# Patient Record
Sex: Female | Born: 1960
Health system: Southern US, Community
[De-identification: ages and names within clinical notes are randomized; demographics above are authoritative.]

## PROBLEM LIST (undated history)

## (undated) DIAGNOSIS — E079 Disorder of thyroid, unspecified: Secondary | ICD-10-CM

## (undated) DIAGNOSIS — M1990 Unspecified osteoarthritis, unspecified site: Secondary | ICD-10-CM

## (undated) DIAGNOSIS — J40 Bronchitis, not specified as acute or chronic: Secondary | ICD-10-CM

## (undated) DIAGNOSIS — I341 Nonrheumatic mitral (valve) prolapse: Secondary | ICD-10-CM

## (undated) DIAGNOSIS — E039 Hypothyroidism, unspecified: Secondary | ICD-10-CM

## (undated) DIAGNOSIS — C439 Malignant melanoma of skin, unspecified: Secondary | ICD-10-CM

## (undated) DIAGNOSIS — J302 Other seasonal allergic rhinitis: Secondary | ICD-10-CM

## (undated) HISTORY — DX: Unspecified osteoarthritis, unspecified site: M19.90

## (undated) HISTORY — DX: Other seasonal allergic rhinitis: J30.2

## (undated) HISTORY — PX: ACHILLES TENDON REPAIR: SUR1153

## (undated) HISTORY — DX: Nonrheumatic mitral (valve) prolapse: I34.1

## (undated) HISTORY — PX: ELBOW SURGERY: SHX618

## (undated) HISTORY — DX: Hypothyroidism, unspecified: E03.9

## (undated) HISTORY — PX: CLAVICLE SURGERY: SHX598

## (undated) HISTORY — PX: FOOT SURGERY: SHX648

## (undated) HISTORY — DX: Malignant melanoma of skin, unspecified: C43.9

---

## 1990-06-25 HISTORY — PX: TUBAL LIGATION: SHX77

## 2009-08-23 ENCOUNTER — Other Ambulatory Visit: Admission: RE | Admit: 2009-08-23 | Discharge: 2009-08-23 | Payer: Self-pay | Admitting: Internal Medicine

## 2013-05-04 DIAGNOSIS — H43819 Vitreous degeneration, unspecified eye: Secondary | ICD-10-CM | POA: Insufficient documentation

## 2016-08-13 ENCOUNTER — Emergency Department (HOSPITAL_COMMUNITY)
Admission: EM | Admit: 2016-08-13 | Discharge: 2016-08-13 | Disposition: A | Discharge status: Home / Self Care | Payer: Managed Care, Other (non HMO) | Attending: Emergency Medicine | Admitting: Emergency Medicine

## 2016-08-13 ENCOUNTER — Encounter (HOSPITAL_COMMUNITY): Payer: Self-pay | Admitting: Emergency Medicine

## 2016-08-13 ENCOUNTER — Emergency Department (HOSPITAL_COMMUNITY): Payer: Managed Care, Other (non HMO)

## 2016-08-13 DIAGNOSIS — Z79899 Other long term (current) drug therapy: Secondary | ICD-10-CM | POA: Diagnosis not present

## 2016-08-13 DIAGNOSIS — Z87891 Personal history of nicotine dependence: Secondary | ICD-10-CM | POA: Insufficient documentation

## 2016-08-13 DIAGNOSIS — Z791 Long term (current) use of non-steroidal anti-inflammatories (NSAID): Secondary | ICD-10-CM | POA: Insufficient documentation

## 2016-08-13 DIAGNOSIS — R091 Pleurisy: Secondary | ICD-10-CM

## 2016-08-13 DIAGNOSIS — J988 Other specified respiratory disorders: Secondary | ICD-10-CM | POA: Insufficient documentation

## 2016-08-13 DIAGNOSIS — B9789 Other viral agents as the cause of diseases classified elsewhere: Secondary | ICD-10-CM

## 2016-08-13 HISTORY — DX: Bronchitis, not specified as acute or chronic: J40

## 2016-08-13 HISTORY — DX: Disorder of thyroid, unspecified: E07.9

## 2016-08-13 LAB — BASIC METABOLIC PANEL
Anion gap: 8 (ref 5–15)
BUN: 19 mg/dL (ref 6–20)
CALCIUM: 9.2 mg/dL (ref 8.9–10.3)
CHLORIDE: 106 mmol/L (ref 101–111)
CO2: 27 mmol/L (ref 22–32)
CREATININE: 0.82 mg/dL (ref 0.44–1.00)
GFR calc Af Amer: >60 mL/min (ref >60)
GFR calc non Af Amer: >60 mL/min (ref >60)
Glucose, Bld: 92 mg/dL (ref 65–99)
Potassium: 4.4 mmol/L (ref 3.5–5.1)
Sodium: 141 mmol/L (ref 135–145)

## 2016-08-13 LAB — TROPONIN I: Troponin I: <0.03 ng/mL (ref <0.03)

## 2016-08-13 LAB — CBC
HCT: 41.8 % (ref 36.0–46.0)
HEMOGLOBIN: 14 g/dL (ref 12.0–15.0)
MCH: 30 pg (ref 26.0–34.0)
MCHC: 33.5 g/dL (ref 30.0–36.0)
MCV: 89.7 fL (ref 78.0–100.0)
PLATELETS: 265 10*3/uL (ref 150–400)
RBC: 4.66 MIL/uL (ref 3.87–5.11)
RDW: 12.3 % (ref 11.5–15.5)
WBC: 7.2 10*3/uL (ref 4.0–10.5)

## 2016-08-13 NOTE — ED Triage Notes (Signed)
Patient c/o right side chest pain that radiates into right chest. Per patient 3-4 days. Denies any shortness of breath, dizziness, nausea, or vomiting. Per patient worse with deep breath. Patient states had flu and bronchitis prior to chest pain and has had cough. Patient has had pleurisy before and feels the same.

## 2016-08-13 NOTE — ED Provider Notes (Signed)
[facility] DEPT Provider Note   CSN: [id number] Arrival date & time: 08/13/16  1754  By signing my name below, I, Oleh Genin, attest that this documentation has been prepared under the direction and in the presence of Forde Dandy, MD. Electronically Signed: Oleh Genin, Scribe. 08/13/16. 9:57 PM.   History   Chief Complaint Chief Complaint  Patient presents with  . Pleurisy    HPI Brittney Schmitt is a 56 y.o. female with history of hypothyroidism who presents to the ED for evaluation of chest pain. This patient states that 10 days ago she developed productive cough, subjective fevers and chills, and fatigue. She was seen by primary care and diagnosed with bronchitis. She finished a course of azithromycin several days ago. She presents stating that in the last 3 days she has experienced R lateral chest wall pain which is worse "when coughing or breathing deeply". Sharp in nature. Not exertional. She continues to experience subjective fevers, cough, fatigue. No dyspnea. Denies any peripheral edema. She has sick contacts at work. Denies nausea, vomiting, diarrhea. Denies dysuria or frequency. No history of DVT/PE, leg swelling, family history of bleeding or clotting disorders, or recent long distance travel.   The history is provided by the patient. No language interpreter was used.    Past Medical History:  Diagnosis Date  . Bronchitis   . Thyroid disease     There are no active problems to display for this patient.   Past Surgical History:  Procedure Laterality Date  . ACHILLES TENDON REPAIR Left   . CLAVICLE SURGERY    . ELBOW SURGERY Left   . FOOT SURGERY      OB History    Gravida Para Term Preterm AB Living   2 2 2     2    SAB TAB Ectopic Multiple Live Births                   Home Medications    Prior to Admission medications   Medication Sig Start Date End Date Taking? Authorizing Provider  BIOTIN PO Take 1 tablet by mouth daily.   Yes Historical  Provider, MD  ibuprofen (ADVIL,MOTRIN) 800 MG tablet Take 800 mg by mouth every 8 (eight) hours as needed for mild pain or moderate pain.   Yes Historical Provider, MD  Multiple Vitamin (MULTIVITAMIN WITH MINERALS) TABS tablet Take 1 tablet by mouth daily.   Yes Historical Provider, MD  Omega-3 300 MG CAPS Take 1 capsule by mouth daily.   Yes Historical Provider, MD  promethazine-dextromethorphan (PROMETHAZINE-DM) 6.25-15 MG/5ML syrup Take 5 mLs by mouth every 6 (six) hours as needed. 08/06/16  Yes Historical Provider, MD  SYNTHROID 100 MCG tablet Take 100 mcg by mouth daily. 07/10/16  Yes Historical Provider, MD  vitamin C (ASCORBIC ACID) 500 MG tablet Take 500 mg by mouth daily.   Yes Historical Provider, MD  azithromycin (ZITHROMAX) 250 MG tablet Take 250-500 mg by mouth See admin instructions. Take 500mg  on ady 1, then take 250mg  on days 2 through 5. 08/06/16   Historical Provider, MD    Family History History reviewed. No pertinent family history.  Social History Social History  Substance Use Topics  . Smoking status: Former Smoker    Years: 15.00    Types: Cigarettes    Quit date: 06/25/2000  . Smokeless tobacco: Never Used  . Alcohol use 3.0 oz/week    5 Cans of beer per week     Allergies   Codeine  Review of Systems Review of Systems 10/14 systems reviewed and are negative except as noted in the HPI.  Physical Exam Updated Vital Signs BP 159/96   Pulse (!) 57   Temp 98.7 F (37.1 C) (Oral)   Resp 16   Ht 5\' 8"  (1.727 m)   Wt 150 lb (68 kg)   SpO2 96%   BMI 22.81 kg/m   Physical Exam Physical Exam  Nursing note and vitals reviewed. Constitutional: Well developed, well nourished, non-toxic, and in no acute distress Head: Normocephalic and atraumatic.  Mouth/Throat: Oropharynx is clear and moist.  Neck: Normal range of motion. Neck supple.  Cardiovascular: Normal rate and regular rhythm.  No peripheral edema or calf tenderness. Pulmonary/Chest: Effort normal  and breath sounds normal.  Abdominal: Soft. There is no tenderness. There is no rebound and no guarding.  Musculoskeletal: Normal range of motion.  Neurological: Alert, no facial droop, fluent speech, moves all extremities symmetrically Skin: Skin is warm and dry.  Psychiatric: Cooperative  ED Treatments / Results  DIAGNOSTIC STUDIES: Oxygen Saturation is 100 percent on room air which is normal by my interpretation.    COORDINATION OF CARE: 9:53 PM Discussed treatment plan with pt at bedside and pt agreed to plan.  Labs (all labs ordered are listed, but only abnormal results are displayed) Labs Reviewed  BASIC METABOLIC PANEL  CBC  TROPONIN I    EKG  EKG Interpretation  Date/Time:  Monday August 13 2016 18:02:26 EST Ventricular Rate:  72 PR Interval:  160 QRS Duration: 78 QT Interval:  386 QTC Calculation: 422 R Axis:   50 Text Interpretation:  Normal sinus rhythm Normal ECG no acute changes  Confirmed by Jamail Cullers MD, Ranbir Chew [phone]) on 08/13/2016 9:51:34 PM       Radiology Dg Chest 2 View  Result Date: 08/13/2016 CLINICAL DATA:  Right-sided chest pain for several days, worse today. EXAM: CHEST  2 VIEW COMPARISON:  None. FINDINGS: The cardiomediastinal silhouette is within normal limits. The lungs are hyperinflated. There is blunting of the costophrenic angles which may be secondary to hyperinflation versus trace pleural effusions. No airspace consolidation, edema, or pneumothorax is seen. No acute osseous abnormality is identified. IMPRESSION: 1. Mild hyperinflation without evidence of acute airspace disease. 2. Possible trace pleural effusions. Electronically Signed   By: Logan Bores M.D.   On: 08/13/2016 18:37    Procedures Procedures (including critical care time)  Medications Ordered in ED Medications - No data to display   Initial Impression / Assessment and Plan / ED Course  I have reviewed the triage vital signs and the nursing notes.  Pertinent labs & imaging  results that were available during my care of the patient were reviewed by me and considered in my medical decision making (see chart for details).     56 year old female otherwise healthy who presents with right-sided chest pain in the setting of viral illness. Presentation seems consistent with that of pleurisy, which she states that she has had before in the past and this is exactly like it. She is well-appearing and in no acute distress. Her vital signs are non-concerning. No PE risk factors and not tachycardic or short of breath. Her EKG does not show any ischemic changes, and her troponin triage is normal. No major risk factors for ACS aside from her age, and presentation seems very atypical for ACS. Chest x-ray visualized a does not show pneumonia, pneumothorax, edema, or other acute cardiopulmonary processes.  Discussed potential screen for PE with ddimer  as she cannot undergo PERC criteria due to age. She has declined this as this feels like pleurisy. Agreed to return if her symptoms change. Discussed supportive care management for her pleurisy. Strict return and follow-up instructions reviewed. She expressed understanding of all discharge instructions and felt comfortable with the plan of care.   Final Clinical Impressions(s) / ED Diagnoses   Final diagnoses:  Pleurisy  Viral respiratory illness    New Prescriptions New Prescriptions   No medications on file   I personally performed the services described in this documentation, which was scribed in my presence. The recorded information has been reviewed and is accurate.    Forde Dandy, MD 08/13/16 2218

## 2016-08-13 NOTE — Discharge Instructions (Signed)
Continue taking tylenol and ibuprofen for pain.  Please follow-up closely with your primary care doctor for recheck.  Return without fail for worsening symptoms, including difficulty breathing, passing out, worsening pain, or any other symptoms concerning to you.

## 2016-08-21 ENCOUNTER — Other Ambulatory Visit (HOSPITAL_COMMUNITY): Payer: Self-pay | Admitting: Respiratory Therapy

## 2016-08-21 DIAGNOSIS — R0600 Dyspnea, unspecified: Secondary | ICD-10-CM

## 2016-08-31 ENCOUNTER — Ambulatory Visit (HOSPITAL_COMMUNITY)
Admission: RE | Admit: 2016-08-31 | Discharge: 2016-08-31 | Disposition: A | Discharge status: Home / Self Care | Payer: Managed Care, Other (non HMO) | Source: Ambulatory Visit | Attending: Internal Medicine | Admitting: Internal Medicine

## 2016-08-31 DIAGNOSIS — R0600 Dyspnea, unspecified: Secondary | ICD-10-CM | POA: Diagnosis not present

## 2016-08-31 DIAGNOSIS — J449 Chronic obstructive pulmonary disease, unspecified: Secondary | ICD-10-CM | POA: Insufficient documentation

## 2016-08-31 LAB — PULMONARY FUNCTION TEST
DL/VA % PRED: 74 %
DL/VA: 3.92 ml/min/mmHg/L
DLCO UNC: 18.65 ml/min/mmHg
DLCO unc % pred: 62 %
FEF 25-75 Pre: 1.86 L/sec
FEF2575-%Pred-Pre: 68 %
FEV1-%Pred-Pre: 84 %
FEV1-Pre: 2.56 L
FEV1FVC-%Pred-Pre: 93 %
FEV6-%Pred-Pre: 91 %
FEV6-PRE: 3.45 L
FEV6FVC-%PRED-PRE: 102 %
FVC-%Pred-Pre: 89 %
FVC-Pre: 3.47 L
PRE FEV6/FVC RATIO: 99 %
Pre FEV1/FVC ratio: 74 %
RV % PRED: 90 %
RV: 1.92 L
TLC % pred: 97 %
TLC: 5.53 L

## 2016-09-11 ENCOUNTER — Institutional Professional Consult (permissible substitution): Payer: Managed Care, Other (non HMO) | Admitting: Pulmonary Disease

## 2016-10-10 ENCOUNTER — Encounter: Payer: Self-pay | Admitting: Internal Medicine

## 2016-10-11 ENCOUNTER — Encounter: Payer: Self-pay | Admitting: Internal Medicine

## 2016-10-11 ENCOUNTER — Ambulatory Visit (INDEPENDENT_AMBULATORY_CARE_PROVIDER_SITE_OTHER)
Admission: RE | Admit: 2016-10-11 | Discharge: 2016-10-11 | Disposition: A | Discharge status: Home / Self Care | Payer: Managed Care, Other (non HMO) | Source: Ambulatory Visit | Attending: Internal Medicine | Admitting: Internal Medicine

## 2016-10-11 ENCOUNTER — Ambulatory Visit (INDEPENDENT_AMBULATORY_CARE_PROVIDER_SITE_OTHER): Payer: Managed Care, Other (non HMO) | Admitting: Internal Medicine

## 2016-10-11 VITALS — BP 108/70 | HR 61 | Ht 67.0 in | Wt 147.4 lb

## 2016-10-11 DIAGNOSIS — R0609 Other forms of dyspnea: Secondary | ICD-10-CM | POA: Diagnosis not present

## 2016-10-11 NOTE — Progress Notes (Signed)
Subjective:    Patient ID: Brittney Schmitt, female    DOB: [birth year],   MRN: [id number]  HPI   56 yowf quit smoking 2002 no respiratory problems x for rhinitis mostly in spring and some doe running no difference then acutely ill with flu like symptoms then dx as "walking Pna"  With mild R ant pleuritic cp and rx zpak / another abx and cough med and gradually improved over 6 weeks but pfts abn so referred to pulmonary clinic 10/11/2016 by Dr   Jani Gravel   10/11/2016 1st [facility] office visit/ [facility]   Chief Complaint  Patient presents with  . Pulmonary Consult    Referred by Dr Maudie Mercury for SOB and abn PFTs.   back playing tennis 3-5 days a week  Training for a 5 k but can't do it s stopping which has been true x 5 years  All cp gone, no cough   No obvious day to day or daytime variability or assoc excess/ purulent sputum or mucus plugs or hemoptysis or cp or chest tightness, subjective wheeze or overt   hb symptoms. No unusual exp hx or h/o childhood pna/ asthma or knowledge of premature birth.  Sleeping ok without nocturnal  or early am exacerbation  of respiratory  c/o's or need for noct saba. Also denies any obvious fluctuation of symptoms with weather or environmental changes or other aggravating or alleviating factors except as outlined above   Current Medications, Allergies, Complete Past Medical History, Past Surgical History, Family History, and Social History were reviewed in [employer] record.        Review of Systems  Constitutional: Negative for fever and unexpected weight change.  HENT: Positive for congestion and sneezing. Negative for dental problem, ear pain, nosebleeds, postnasal drip, rhinorrhea, sinus pressure, sore throat and trouble swallowing.   Eyes: Negative for redness and itching.  Respiratory: Positive for shortness of breath. Negative for cough, chest tightness and wheezing.   Cardiovascular: Negative for palpitations and leg  swelling.  Gastrointestinal: Negative for nausea and vomiting.  Genitourinary: Negative for dysuria.  Musculoskeletal: Negative for joint swelling.  Skin: Negative for rash.  Neurological: Negative for headaches.  Hematological: Does not bruise/bleed easily.  Psychiatric/Behavioral: Negative for dysphoric mood. The patient is not nervous/anxious.        Objective:   Physical Exam  amb wf nad  Wt Readings from Last 3 Encounters:  10/11/16 147 lb 6.4 oz (66.9 kg)  08/13/16 150 lb (68 kg)    Vital signs reviewed  - Note on arrival 02 sats  99% on RA    HEENT: nl dentition, turbinates bilaterally, and oropharynx. Nl external ear canals without cough reflex   NECK :  without JVD/Nodes/TM/ nl carotid upstrokes bilaterally   LUNGS: no acc muscle use,  Nl contour chest which is clear to A and P bilaterally without cough on insp or exp maneuvers   CV:  RRR  no s3 or murmur or increase in P2, and no edema   ABD:  soft and nontender with nl inspiratory excursion in the supine position. No bruits or organomegaly appreciated, bowel sounds nl  MS:  Nl gait/ ext warm without deformities, calf tenderness, cyanosis or clubbing No obvious joint restrictions   SKIN: warm and dry without lesions    NEURO:  alert, approp, nl sensorium with  no motor or cerebellar deficits apparent.     CXR PA and Lateral:   10/11/2016 :    I  personally reviewed images and agree with radiology impression as follows:    Mediastinum hilar structures are normal. Interim resolution of right base subsegmental atelectasis. No focal infiltrate. No prominent pleural effusion or pneumothorax. Heart size normal. Normal pulmonary vascularity. Mild thoracolumbar spine scoliosis. No acute bony abnormality.        Assessment & Plan:

## 2016-10-11 NOTE — Patient Instructions (Addendum)
Monitor your pulse oximetry with exertion and if dropping any more than 4% or below 90% you will need follow up here   Avoid macrodantin for kidney infection  Please remember to go to the x-ray department downstairs in the basement  for your tests - we will call you with the results when they are available.  A copy of this consultation will be sent to your PCP

## 2016-10-11 NOTE — Assessment & Plan Note (Addendum)
-   PFT's  08/28/2016  FVC 3.47 (89%)   with DLCO  62 % corrects to 72 % for alv volume    She is concerned because her father apparently had IPF but this dlco was obtained w/in a few weeks of acute resp infection and she has since resumed nl ex which includes training for a 5k so I see nothing to suggest incipient ipf here  I did advise if she wants "early warning" the first physiologic abn = desats with heavy exertion which can be monitored by using pulse ox while jogging with f/u HRCT if notes any significant drop even at high levels of exercise as this should not be the case with nl lung function.  Total time devoted to counseling  > 50 % of initial 60 min office visit:  review case with pt/ discussion of options/alternatives/ personally creating written customized instructions  in presence of pt  then going over those specific  Instructions directly with the pt including how to use all of the meds but in particular covering each new medication in detail and the difference between the maintenance= "automatic" meds and the prns using an action plan format for the latter (If this problem/symptom => do that organization reading Left to right).  Please see AVS from this visit for a full list of these instructions which I personally wrote for this pt and  are unique to this visit.

## 2016-10-12 ENCOUNTER — Telehealth: Payer: Self-pay | Admitting: Internal Medicine

## 2016-10-12 NOTE — Telephone Encounter (Signed)
Call pt: Reviewed cxr and no acute change so no change in recommendations made at ov .  Called and lmom to make the pt aware of results.

## 2016-10-25 ENCOUNTER — Encounter: Payer: Self-pay | Admitting: Family Medicine

## 2016-11-15 ENCOUNTER — Encounter: Payer: Self-pay | Admitting: Family Medicine

## 2016-11-20 ENCOUNTER — Encounter: Payer: Self-pay | Admitting: Family Medicine

## 2016-11-20 ENCOUNTER — Other Ambulatory Visit: Payer: Managed Care, Other (non HMO)

## 2016-11-20 ENCOUNTER — Other Ambulatory Visit: Payer: Self-pay | Admitting: Family Medicine

## 2016-11-20 ENCOUNTER — Telehealth: Payer: Self-pay | Admitting: Family Medicine

## 2016-11-20 DIAGNOSIS — E039 Hypothyroidism, unspecified: Secondary | ICD-10-CM | POA: Insufficient documentation

## 2016-11-20 DIAGNOSIS — Z0000 Encounter for general adult medical examination without abnormal findings: Secondary | ICD-10-CM | POA: Insufficient documentation

## 2016-11-21 LAB — CMP14+EGFR
A/G RATIO: 2 (ref 1.2–2.2)
ALK PHOS: 76 IU/L (ref 39–117)
ALT: 12 IU/L (ref 0–32)
AST: 23 IU/L (ref 0–40)
Albumin: 4.4 g/dL (ref 3.5–5.5)
BUN/Creatinine Ratio: 16 (ref 9–23)
BUN: 15 mg/dL (ref 6–24)
Bilirubin Total: 0.5 mg/dL (ref 0.0–1.2)
CO2: 24 mmol/L (ref 18–29)
CREATININE: 0.91 mg/dL (ref 0.57–1.00)
Calcium: 9.1 mg/dL (ref 8.7–10.2)
Chloride: 107 mmol/L — ABNORMAL HIGH (ref 96–106)
GFR calc Af Amer: 81 mL/min/{1.73_m2} (ref >59)
GFR calc non Af Amer: 70 mL/min/{1.73_m2} (ref >59)
GLOBULIN, TOTAL: 2.2 g/dL (ref 1.5–4.5)
Glucose: 84 mg/dL (ref 65–99)
POTASSIUM: 5.1 mmol/L (ref 3.5–5.2)
SODIUM: 142 mmol/L (ref 134–144)
Total Protein: 6.6 g/dL (ref 6.0–8.5)

## 2016-11-21 LAB — CBC WITH DIFFERENTIAL/PLATELET
BASOS: 1 %
Basophils Absolute: 0.1 10*3/uL (ref 0.0–0.2)
EOS (ABSOLUTE): 0.4 10*3/uL (ref 0.0–0.4)
EOS: 6 %
HEMATOCRIT: 44.8 % (ref 34.0–46.6)
Hemoglobin: 14.3 g/dL (ref 11.1–15.9)
Immature Grans (Abs): 0 10*3/uL (ref 0.0–0.1)
Immature Granulocytes: 0 %
Lymphocytes Absolute: 2.1 10*3/uL (ref 0.7–3.1)
Lymphs: 38 %
MCH: 29.2 pg (ref 26.6–33.0)
MCHC: 31.9 g/dL (ref 31.5–35.7)
MCV: 92 fL (ref 79–97)
MONOS ABS: 0.4 10*3/uL (ref 0.1–0.9)
Monocytes: 8 %
NEUTROS PCT: 47 %
Neutrophils Absolute: 2.6 10*3/uL (ref 1.4–7.0)
PLATELETS: 228 10*3/uL (ref 150–379)
RBC: 4.89 x10E6/uL (ref 3.77–5.28)
RDW: 13.9 % (ref 12.3–15.4)
WBC: 5.6 10*3/uL (ref 3.4–10.8)

## 2016-11-21 LAB — TSH: TSH: 2.71 u[IU]/mL (ref 0.450–4.500)

## 2016-11-21 LAB — LIPID PANEL
CHOL/HDL RATIO: 2.5 ratio (ref 0.0–4.4)
Cholesterol, Total: 208 mg/dL — ABNORMAL HIGH (ref 100–199)
HDL: 84 mg/dL (ref >39)
LDL Calculated: 110 mg/dL — ABNORMAL HIGH (ref 0–99)
Triglycerides: 72 mg/dL (ref 0–149)
VLDL Cholesterol Cal: 14 mg/dL (ref 5–40)

## 2016-11-22 ENCOUNTER — Ambulatory Visit (INDEPENDENT_AMBULATORY_CARE_PROVIDER_SITE_OTHER): Payer: Managed Care, Other (non HMO) | Admitting: Family Medicine

## 2016-11-22 ENCOUNTER — Encounter: Payer: Self-pay | Admitting: Family Medicine

## 2016-11-22 VITALS — BP 121/80 | HR 65 | Temp 97.5°F | Ht 67.0 in | Wt 149.6 lb

## 2016-11-22 DIAGNOSIS — Z0000 Encounter for general adult medical examination without abnormal findings: Secondary | ICD-10-CM

## 2016-11-22 DIAGNOSIS — D229 Melanocytic nevi, unspecified: Secondary | ICD-10-CM

## 2016-11-22 DIAGNOSIS — Z23 Encounter for immunization: Secondary | ICD-10-CM

## 2016-11-22 DIAGNOSIS — Z85820 Personal history of malignant melanoma of skin: Secondary | ICD-10-CM | POA: Insufficient documentation

## 2016-11-22 NOTE — Progress Notes (Addendum)
   HPI  Patient presents today to establish care with an annual physical exam.  She would like a Pap smear today, she has not had a period in 6 years. Her hot flashes are improving.  Patient has history of melanoma, she has 2 brown spots on the right quad that she would like looked at. They've been there for many years without changing. Her melanoma was dark black and found on biopsy.  Patient would like tetanus injection today.  She feels well and has no complaints otherwise.  She does not want to see a dermatologist  PMH: Melanoma, hypothyroidism Past surgical history: Tubal ligation, Achilles tendon repair, elbow clavicle and foot surgery Past family history: Mother with asthma, mother with breast cancer, father with pulmonary fibrosis Past social history: Former smoker, regular small amount of alcohol usage, no drug use ROS: Per HPI  Objective: BP 121/80   Pulse 65   Temp 97.5 F (36.4 C) (Oral)   Ht 5\' 7"  (1.702 m)   Wt 149 lb 9.6 oz (67.9 kg)   BMI 23.43 kg/m  Gen: NAD, alert, cooperative with exam HEENT: NCAT, EOMI, PERRL Neck: No thyromegaly CV: RRR, good S1/S2, no murmur Resp: CTABL, no wheezes, non-labored Abd: SNTND, BS present, no guarding or organomegaly Ext: No edema, warm Neuro: Alert and oriented, 1+ symmetric patellar tendon reflexes, strength 5/5 and sensation intact in bilateral lower extremities Skin: 2 brown moles on the right anterior thigh, the distal measures 3 mm x 3.5 mm, the superior lesion measures 4.5 mm x 3 mm, they are light brown in color with irregular borders Pelvic:  Normal appearing perineum, normal-appearing cervix, nontender to palpation, no adnexal masses palpated with bimanual exam  Assessment and plan:  # Physical exam Normal exam, and reviewed labs with patient today Weight is healthy Pap smear collected today, code test, repeat in 5 years Colonoscopy was at age 66  # History of melanoma, benign mole Patient with 2 moles  in the right thigh she would like examined today Recommended dermatology, she would like to avoid this if possible Recommended discussion and exam of the skin at least twice annually Measured carefully, offered watchful waiting versus punch biopsy, she would like to watch carefully.  Counseling provided for all immunization components administered today-tetanus  Orders Placed This Encounter  Procedures  . Ambulatory referral to Dermatology    Referral Priority:   Routine    Referral Type:   Consultation    Referral Reason:   Specialty Services Required    Requested Specialty:   Dermatology    Number of Visits Requested:   1    Meds ordered this encounter  Medications  . cetirizine (ZYRTEC) 10 MG tablet    Sig: Take 10 mg by mouth daily.  . fluticasone (VERAMYST) 27.5 MCG/SPRAY nasal spray    Sig: Place 2 sprays into the nose daily.    Laroy Apple, MD [facility] 11/22/2016, 10:44 AM

## 2016-11-22 NOTE — Addendum Note (Signed)
Addended by: Nigel Berthold C on: 11/22/2016 11:12 AM   Modules accepted: Orders

## 2016-11-22 NOTE — Patient Instructions (Addendum)
Great to see you!  Lets be sure to keep a close eye on the moles that we looked, please keep a close eye on your skin.   Health Maintenance, Female Adopting a healthy lifestyle and getting preventive care can go a long way to promote health and wellness. Talk with your health care provider about what schedule of regular examinations is right for you. This is a good chance for you to check in with your provider about disease prevention and staying healthy. In between checkups, there are plenty of things you can do on your own. Experts have done a lot of research about which lifestyle changes and preventive measures are most likely to keep you healthy. Ask your health care provider for more information. Weight and diet Eat a healthy diet  Be sure to include plenty of vegetables, fruits, low-fat dairy products, and lean protein.  Do not eat a lot of foods high in solid fats, added sugars, or salt.  Get regular exercise. This is one of the most important things you can do for your health. ? Most adults should exercise for at least 150 minutes each week. The exercise should increase your heart rate and make you sweat (moderate-intensity exercise). ? Most adults should also do strengthening exercises at least twice a week. This is in addition to the moderate-intensity exercise.  Maintain a healthy weight  Body mass index (BMI) is a measurement that can be used to identify possible weight problems. It estimates body fat based on height and weight. Your health care provider can help determine your BMI and help you achieve or maintain a healthy weight.  For females 2 years of age and older: ? A BMI below 18.5 is considered underweight. ? A BMI of 18.5 to 24.9 is normal. ? A BMI of 25 to 29.9 is considered overweight. ? A BMI of 30 and above is considered obese.  Watch levels of cholesterol and blood lipids  You should start having your blood tested for lipids and cholesterol at 58 years of age,  then have this test every 5 years.  You may need to have your cholesterol levels checked more often if: ? Your lipid or cholesterol levels are high. ? You are older than 58 years of age. ? You are at high risk for heart disease.  Cancer screening Lung Cancer  Lung cancer screening is recommended for adults 33-99 years old who are at high risk for lung cancer because of a history of smoking.  A yearly low-dose CT scan of the lungs is recommended for people who: ? Currently smoke. ? Have quit within the past 15 years. ? Have at least a 30-pack-year history of smoking. A pack year is smoking an average of one pack of cigarettes a day for 1 year.  Yearly screening should continue until it has been 15 years since you quit.  Yearly screening should stop if you develop a health problem that would prevent you from having lung cancer treatment.  Breast Cancer  Practice breast self-awareness. This means understanding how your breasts normally appear and feel.  It also means doing regular breast self-exams. Let your health care provider know about any changes, no matter how small.  If you are in your 20s or 30s, you should have a clinical breast exam (CBE) by a health care provider every 1-3 years as part of a regular health exam.  If you are 10 or older, have a CBE every year. Also consider having a breast X-ray (  mammogram) every year.  If you have a family history of breast cancer, talk to your health care provider about genetic screening.  If you are at high risk for breast cancer, talk to your health care provider about having an MRI and a mammogram every year.  Breast cancer gene (BRCA) assessment is recommended for women who have family members with BRCA-related cancers. BRCA-related cancers include: ? Breast. ? Ovarian. ? Tubal. ? Peritoneal cancers.  Results of the assessment will determine the need for genetic counseling and BRCA1 and BRCA2 testing.  Cervical Cancer Your  health care provider may recommend that you be screened regularly for cancer of the pelvic organs (ovaries, uterus, and vagina). This screening involves a pelvic examination, including checking for microscopic changes to the surface of your cervix (Pap test). You may be encouraged to have this screening done every 3 years, beginning at age 5.  For women ages 58-65, health care providers may recommend pelvic exams and Pap testing every 3 years, or they may recommend the Pap and pelvic exam, combined with testing for human papilloma virus (HPV), every 5 years. Some types of HPV increase your risk of cervical cancer. Testing for HPV may also be done on women of any age with unclear Pap test results.  Other health care providers may not recommend any screening for nonpregnant women who are considered low risk for pelvic cancer and who do not have symptoms. Ask your health care provider if a screening pelvic exam is right for you.  If you have had past treatment for cervical cancer or a condition that could lead to cancer, you need Pap tests and screening for cancer for at least 20 years after your treatment. If Pap tests have been discontinued, your risk factors (such as having a new sexual partner) need to be reassessed to determine if screening should resume. Some women have medical problems that increase the chance of getting cervical cancer. In these cases, your health care provider may recommend more frequent screening and Pap tests.  Colorectal Cancer  This type of cancer can be detected and often prevented.  Routine colorectal cancer screening usually begins at 58 years of age and continues through 58 years of age.  Your health care provider may recommend screening at an earlier age if you have risk factors for colon cancer.  Your health care provider may also recommend using home test kits to check for hidden blood in the stool.  A small camera at the end of a tube can be used to examine your  colon directly (sigmoidoscopy or colonoscopy). This is done to check for the earliest forms of colorectal cancer.  Routine screening usually begins at age 87.  Direct examination of the colon should be repeated every 5-10 years through 58 years of age. However, you may need to be screened more often if early forms of precancerous polyps or small growths are found.  Skin Cancer  Check your skin from head to toe regularly.  Tell your health care provider about any new moles or changes in moles, especially if there is a change in a mole's shape or color.  Also tell your health care provider if you have a mole that is larger than the size of a pencil eraser.  Always use sunscreen. Apply sunscreen liberally and repeatedly throughout the day.  Protect yourself by wearing long sleeves, pants, a wide-brimmed hat, and sunglasses whenever you are outside.  Heart disease, diabetes, and high blood pressure  High blood pressure  causes heart disease and increases the risk of stroke. High blood pressure is more likely to develop in: ? People who have blood pressure in the high end of the normal range (130-139/85-89 mm Hg). ? People who are overweight or obese. ? People who are African American.  If you are 53-32 years of age, have your blood pressure checked every 3-5 years. If you are 68 years of age or older, have your blood pressure checked every year. You should have your blood pressure measured twice-once when you are at a hospital or clinic, and once when you are not at a hospital or clinic. Record the average of the two measurements. To check your blood pressure when you are not at a hospital or clinic, you can use: ? An automated blood pressure machine at a pharmacy. ? A home blood pressure monitor.  If you are between 67 years and 60 years old, ask your health care provider if you should take aspirin to prevent strokes.  Have regular diabetes screenings. This involves taking a blood sample  to check your fasting blood sugar level. ? If you are at a normal weight and have a low risk for diabetes, have this test once every three years after 58 years of age. ? If you are overweight and have a high risk for diabetes, consider being tested at a younger age or more often. Preventing infection Hepatitis B  If you have a higher risk for hepatitis B, you should be screened for this virus. You are considered at high risk for hepatitis B if: ? You were born in a country where hepatitis B is common. Ask your health care provider which countries are considered high risk. ? Your parents were born in a high-risk country, and you have not been immunized against hepatitis B (hepatitis B vaccine). ? You have HIV or AIDS. ? You use needles to inject street drugs. ? You live with someone who has hepatitis B. ? You have had sex with someone who has hepatitis B. ? You get hemodialysis treatment. ? You take certain medicines for conditions, including cancer, organ transplantation, and autoimmune conditions.  Hepatitis C  Blood testing is recommended for: ? Everyone born from [birth year] through 1967. ? Anyone with known risk factors for hepatitis C.  Sexually transmitted infections (STIs)  You should be screened for sexually transmitted infections (STIs) including gonorrhea and chlamydia if: ? You are sexually active and are younger than 58 years of age. ? You are older than 58 years of age and your health care provider tells you that you are at risk for this type of infection. ? Your sexual activity has changed since you were last screened and you are at an increased risk for chlamydia or gonorrhea. Ask your health care provider if you are at risk.  If you do not have HIV, but are at risk, it may be recommended that you take a prescription medicine daily to prevent HIV infection. This is called pre-exposure prophylaxis (PrEP). You are considered at risk if: ? You are sexually active and do not  regularly use condoms or know the HIV status of your partner(s). ? You take drugs by injection. ? You are sexually active with a partner who has HIV.  Talk with your health care provider about whether you are at high risk of being infected with HIV. If you choose to begin PrEP, you should first be tested for HIV. You should then be tested every 3 months for as long  as you are taking PrEP. Pregnancy  If you are premenopausal and you may become pregnant, ask your health care provider about preconception counseling.  If you may become pregnant, take 400 to 800 micrograms (mcg) of folic acid every day.  If you want to prevent pregnancy, talk to your health care provider about birth control (contraception). Osteoporosis and menopause  Osteoporosis is a disease in which the bones lose minerals and strength with aging. This can result in serious bone fractures. Your risk for osteoporosis can be identified using a bone density scan.  If you are 8 years of age or older, or if you are at risk for osteoporosis and fractures, ask your health care provider if you should be screened.  Ask your health care provider whether you should take a calcium or vitamin D supplement to lower your risk for osteoporosis.  Menopause may have certain physical symptoms and risks.  Hormone replacement therapy may reduce some of these symptoms and risks. Talk to your health care provider about whether hormone replacement therapy is right for you. Follow these instructions at home:  Schedule regular health, dental, and eye exams.  Stay current with your immunizations.  Do not use any tobacco products including cigarettes, chewing tobacco, or electronic cigarettes.  If you are pregnant, do not drink alcohol.  If you are breastfeeding, limit how much and how often you drink alcohol.  Limit alcohol intake to no more than 1 drink per day for nonpregnant women. One drink equals 12 ounces of beer, 5 ounces of wine, or  1 ounces of hard liquor.  Do not use street drugs.  Do not share needles.  Ask your health care provider for help if you need support or information about quitting drugs.  Tell your health care provider if you often feel depressed.  Tell your health care provider if you have ever been abused or do not feel safe at home. This information is not intended to replace advice given to you by your health care provider. Make sure you discuss any questions you have with your health care provider. Document Released: 12/25/2010 Document Revised: 11/17/2015 Document Reviewed: 03/15/2015 Elsevier Interactive Patient Education  [employer].

## 2016-11-25 LAB — PAP IG AND HPV HIGH-RISK
HPV, high-risk: NEGATIVE
PAP Smear Comment: 0

## 2016-11-26 ENCOUNTER — Encounter: Payer: Self-pay | Admitting: Family Medicine

## 2017-04-01 ENCOUNTER — Telehealth: Payer: Self-pay | Admitting: Family Medicine

## 2017-04-01 NOTE — Telephone Encounter (Signed)
What is the name of the medication? Synthroid 100 mg  Have you contacted your pharmacy to request a refill? YES  Which pharmacy would you like this sent to? [facility] in [place]  Patient notified that their request is being sent to the clinical staff for review and that they should receive a call once it is complete. If they do not receive a call within 24 hours they can check with their pharmacy or our office.

## 2017-04-03 ENCOUNTER — Ambulatory Visit (INDEPENDENT_AMBULATORY_CARE_PROVIDER_SITE_OTHER): Payer: Managed Care, Other (non HMO)

## 2017-04-03 DIAGNOSIS — Z23 Encounter for immunization: Secondary | ICD-10-CM

## 2017-04-03 MED ORDER — SYNTHROID 100 MCG PO TABS: 100.0000 ug | ORAL_TABLET | Freq: Every day | ORAL | 0 refills | Status: DC

## 2017-04-29 ENCOUNTER — Telehealth: Payer: Self-pay | Admitting: Family Medicine

## 2017-04-29 MED ORDER — LEVOTHYROXINE SODIUM 100 MCG PO TABS: 100.0000 ug | ORAL_TABLET | Freq: Every day | ORAL | 3 refills | Status: DC

## 2017-04-29 NOTE — Telephone Encounter (Signed)
rx sent  Laroy Apple, MD [facility] 04/29/2017, 11:09 AM

## 2017-04-29 NOTE — Telephone Encounter (Signed)
Patient aware.

## 2017-04-29 NOTE — Telephone Encounter (Signed)
Looks like patient has always received DAW. Please review

## 2017-05-01 ENCOUNTER — Other Ambulatory Visit: Payer: Self-pay | Admitting: Family Medicine

## 2017-07-31 ENCOUNTER — Other Ambulatory Visit: Payer: Self-pay

## 2017-07-31 MED ORDER — LEVOTHYROXINE SODIUM 100 MCG PO TABS: 100.0000 ug | ORAL_TABLET | Freq: Every day | ORAL | 0 refills | Status: DC

## 2017-09-26 DIAGNOSIS — H40033 Anatomical narrow angle, bilateral: Secondary | ICD-10-CM | POA: Diagnosis not present

## 2017-09-26 DIAGNOSIS — H04123 Dry eye syndrome of bilateral lacrimal glands: Secondary | ICD-10-CM | POA: Diagnosis not present

## 2017-10-17 DIAGNOSIS — X32XXXD Exposure to sunlight, subsequent encounter: Secondary | ICD-10-CM | POA: Diagnosis not present

## 2017-10-17 DIAGNOSIS — Z85820 Personal history of malignant melanoma of skin: Secondary | ICD-10-CM | POA: Diagnosis not present

## 2017-10-17 DIAGNOSIS — Z1283 Encounter for screening for malignant neoplasm of skin: Secondary | ICD-10-CM | POA: Diagnosis not present

## 2017-10-17 DIAGNOSIS — L570 Actinic keratosis: Secondary | ICD-10-CM | POA: Diagnosis not present

## 2017-10-17 DIAGNOSIS — Z08 Encounter for follow-up examination after completed treatment for malignant neoplasm: Secondary | ICD-10-CM | POA: Diagnosis not present

## 2017-11-06 ENCOUNTER — Other Ambulatory Visit: Payer: Self-pay | Admitting: Family Medicine

## 2017-11-06 NOTE — Telephone Encounter (Signed)
OV 11/25/17

## 2017-11-25 ENCOUNTER — Encounter: Payer: Self-pay | Admitting: Family Medicine

## 2017-11-25 ENCOUNTER — Ambulatory Visit (INDEPENDENT_AMBULATORY_CARE_PROVIDER_SITE_OTHER): Payer: BLUE CROSS/BLUE SHIELD | Admitting: Family Medicine

## 2017-11-25 VITALS — BP 126/76 | HR 57 | Temp 97.3°F | Ht 67.0 in | Wt 147.2 lb

## 2017-11-25 DIAGNOSIS — E039 Hypothyroidism, unspecified: Secondary | ICD-10-CM

## 2017-11-25 DIAGNOSIS — Z0000 Encounter for general adult medical examination without abnormal findings: Secondary | ICD-10-CM

## 2017-11-25 NOTE — Patient Instructions (Signed)
Great to see you!  Come back in 1 year unless you need [place] sooner, We discussed you would like to see Dr. Evette Doffing  Congratulations on your healthy choices!

## 2017-11-25 NOTE — Progress Notes (Signed)
   HPI  Patient presents today for an annual physical exam.  Feels well and has no complaints.  She is exercising 4-5 times a week and watching her diet very closely with a low-carb diet.  She is proud of her 10 pound weight loss recently looking to lose a little bit more  Patient denies any symptoms of hypothyroidism, we discussed several specifically including hair loss, skin changes, depression, or weight gain.  She is fasting today, she has gone running a 5K this morning.  PMH: Smoking status noted ROS: Per HPI  Objective: BP 126/76   Pulse (!) 57   Temp (!) 97.3 F (36.3 C) (Oral)   Ht '5\' 7"'$  (1.702 m)   Wt 147 lb 3.2 oz (66.8 kg)   BMI 23.05 kg/m  Gen: NAD, alert, cooperative with exam HEENT: NCAT, EOMI, PERRL TMs normal bilaterally CV: RRR, good S1/S2, no murmur Resp: CTABL, no wheezes, non-labored Abd: SNTND, BS present, no guarding or organomegaly Ext: No edema, warm Neuro: Alert and oriented, No gross deficits  Assessment and plan:  #Annual physical exam Normal exam, weight normal also, congratulated on very healthy choices. Labs fasting today Screening hep C Discussed Shingrix  #Hypothyroidism Labs today, asymptomatic No changes    Orders Placed This Encounter  Procedures  . CBC with Differential/Platelet  . CMP14+EGFR  . Lipid panel  . TSH  . Hepatitis C antibody    Laroy Apple, MD [facility] 11/25/2017, 11:20 AM

## 2017-11-26 LAB — CBC WITH DIFFERENTIAL/PLATELET
Basophils Absolute: 0 10*3/uL (ref 0.0–0.2)
Basos: 1 %
EOS (ABSOLUTE): 0.2 10*3/uL (ref 0.0–0.4)
EOS: 2 %
HEMATOCRIT: 42.1 % (ref 34.0–46.6)
Hemoglobin: 14.2 g/dL (ref 11.1–15.9)
Immature Grans (Abs): 0 10*3/uL (ref 0.0–0.1)
Immature Granulocytes: 0 %
Lymphocytes Absolute: 1.4 10*3/uL (ref 0.7–3.1)
Lymphs: 21 %
MCH: 30 pg (ref 26.6–33.0)
MCHC: 33.7 g/dL (ref 31.5–35.7)
MCV: 89 fL (ref 79–97)
MONOCYTES: 8 %
MONOS ABS: 0.6 10*3/uL (ref 0.1–0.9)
NEUTROS PCT: 68 %
Neutrophils Absolute: 4.7 10*3/uL (ref 1.4–7.0)
Platelets: 253 10*3/uL (ref 150–450)
RBC: 4.74 x10E6/uL (ref 3.77–5.28)
RDW: 13.7 % (ref 12.3–15.4)
WBC: 6.8 10*3/uL (ref 3.4–10.8)

## 2017-11-26 LAB — LIPID PANEL
CHOL/HDL RATIO: 2.1 ratio (ref 0.0–4.4)
CHOLESTEROL TOTAL: 186 mg/dL (ref 100–199)
HDL: 87 mg/dL (ref >39)
LDL CALC: 88 mg/dL (ref 0–99)
TRIGLYCERIDES: 56 mg/dL (ref 0–149)
VLDL CHOLESTEROL CAL: 11 mg/dL (ref 5–40)

## 2017-11-26 LAB — CMP14+EGFR
ALK PHOS: 74 IU/L (ref 39–117)
ALT: 14 IU/L (ref 0–32)
AST: 20 IU/L (ref 0–40)
Albumin/Globulin Ratio: 2 (ref 1.2–2.2)
Albumin: 4.5 g/dL (ref 3.5–5.5)
BUN/Creatinine Ratio: 19 (ref 9–23)
BUN: 17 mg/dL (ref 6–24)
Bilirubin Total: 0.6 mg/dL (ref 0.0–1.2)
CO2: 22 mmol/L (ref 20–29)
CREATININE: 0.9 mg/dL (ref 0.57–1.00)
Calcium: 9.6 mg/dL (ref 8.7–10.2)
Chloride: 104 mmol/L (ref 96–106)
GFR calc Af Amer: 82 mL/min/{1.73_m2} (ref >59)
GFR, EST NON AFRICAN AMERICAN: 71 mL/min/{1.73_m2} (ref >59)
GLOBULIN, TOTAL: 2.2 g/dL (ref 1.5–4.5)
GLUCOSE: 94 mg/dL (ref 65–99)
Potassium: 4.8 mmol/L (ref 3.5–5.2)
SODIUM: 142 mmol/L (ref 134–144)
Total Protein: 6.7 g/dL (ref 6.0–8.5)

## 2017-11-26 LAB — TSH: TSH: 2.71 u[IU]/mL (ref 0.450–4.500)

## 2017-11-26 LAB — HEPATITIS C ANTIBODY: Hep C Virus Ab: <0.1 s/co ratio (ref 0.0–0.9)

## 2018-02-03 ENCOUNTER — Other Ambulatory Visit: Payer: Self-pay

## 2018-02-03 MED ORDER — SYNTHROID 100 MCG PO TABS: 100.0000 ug | ORAL_TABLET | Freq: Every day | ORAL | 2 refills | Status: DC

## 2018-02-03 MED ORDER — SYNTHROID 100 MCG PO TABS: 100.0000 ug | ORAL_TABLET | Freq: Every day | ORAL | 9 refills | Status: DC

## 2018-02-03 MED ORDER — IBUPROFEN 800 MG PO TABS: 800.0000 mg | ORAL_TABLET | Freq: Three times a day (TID) | ORAL | 1 refills | Status: DC | PRN

## 2018-03-03 IMAGING — DX DG CHEST 2V
2 series · 2 of 2 positions shown · non-contrast
Comparison: None.

CLINICAL DATA: Right-sided chest pain for several days, worse
today.

EXAM:
CHEST  2 VIEW

[chest pa]
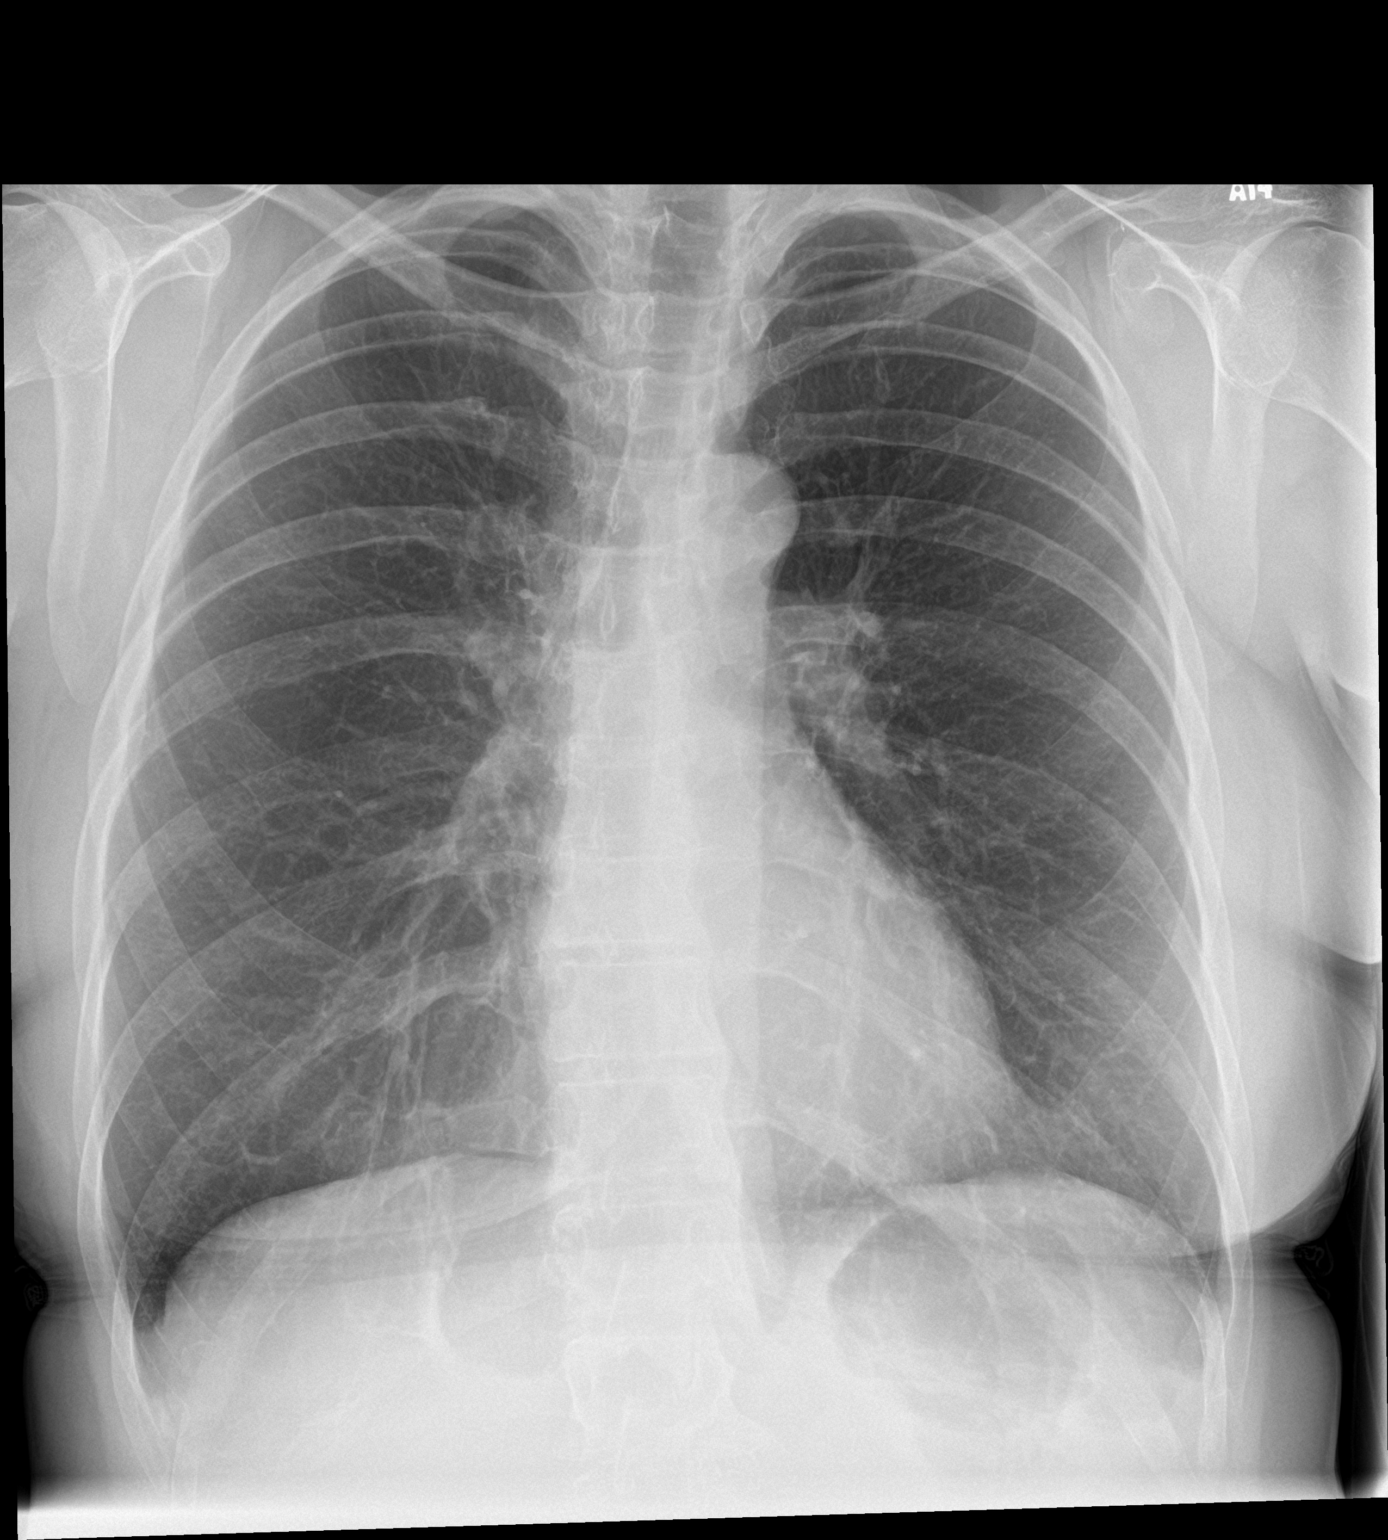

[chest lat]
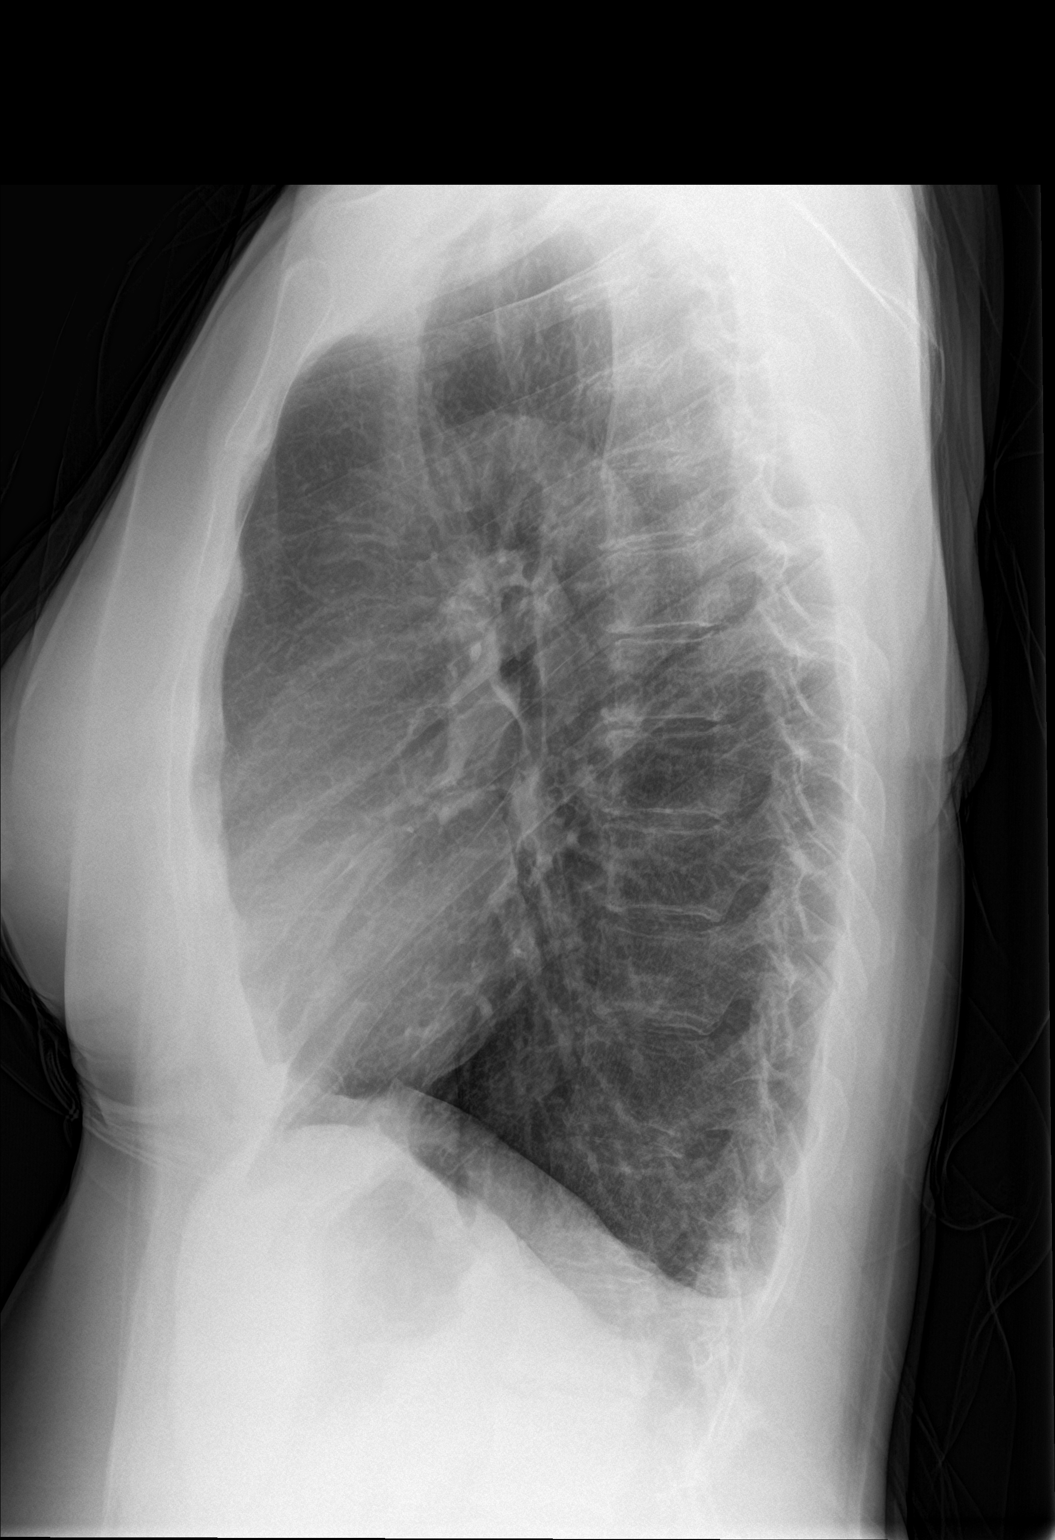

[2 of 2 positions shown; findings below may reference images not displayed]

FINDINGS: The cardiomediastinal silhouette is within normal limits. The lungs
are hyperinflated. There is blunting of the costophrenic angles
which may be secondary to hyperinflation versus trace pleural
effusions. No airspace consolidation, edema, or pneumothorax is
seen. No acute osseous abnormality is identified.
IMPRESSION: 1. Mild hyperinflation without evidence of acute airspace disease.
2. Possible trace pleural effusions.

## 2018-04-03 ENCOUNTER — Ambulatory Visit (INDEPENDENT_AMBULATORY_CARE_PROVIDER_SITE_OTHER): Payer: BLUE CROSS/BLUE SHIELD

## 2018-04-03 DIAGNOSIS — Z23 Encounter for immunization: Secondary | ICD-10-CM | POA: Diagnosis not present

## 2018-04-11 ENCOUNTER — Other Ambulatory Visit: Payer: Self-pay | Admitting: *Deleted

## 2018-04-11 MED ORDER — SYNTHROID 100 MCG PO TABS: 100.0000 ug | ORAL_TABLET | Freq: Every day | ORAL | 2 refills | Status: DC

## 2018-04-15 DIAGNOSIS — Z1283 Encounter for screening for malignant neoplasm of skin: Secondary | ICD-10-CM | POA: Diagnosis not present

## 2018-04-15 DIAGNOSIS — Z85820 Personal history of malignant melanoma of skin: Secondary | ICD-10-CM | POA: Diagnosis not present

## 2018-04-15 DIAGNOSIS — Z08 Encounter for follow-up examination after completed treatment for malignant neoplasm: Secondary | ICD-10-CM | POA: Diagnosis not present

## 2018-04-15 DIAGNOSIS — D485 Neoplasm of uncertain behavior of skin: Secondary | ICD-10-CM | POA: Diagnosis not present

## 2018-04-15 DIAGNOSIS — D225 Melanocytic nevi of trunk: Secondary | ICD-10-CM | POA: Diagnosis not present

## 2018-08-07 ENCOUNTER — Ambulatory Visit: Payer: BLUE CROSS/BLUE SHIELD | Admitting: Family Medicine

## 2018-08-07 ENCOUNTER — Encounter: Payer: Self-pay | Admitting: Family Medicine

## 2018-08-07 VITALS — BP 124/85 | HR 66 | Temp 97.1°F | Ht 67.0 in | Wt 148.2 lb

## 2018-08-07 DIAGNOSIS — J340 Abscess, furuncle and carbuncle of nose: Secondary | ICD-10-CM

## 2018-08-07 MED ORDER — MUPIROCIN 2 % EX OINT: 1.0000 "application " | TOPICAL_OINTMENT | Freq: Two times a day (BID) | CUTANEOUS | 0 refills | Status: DC

## 2018-08-07 MED ORDER — SULFAMETHOXAZOLE-TRIMETHOPRIM 800-160 MG PO TABS: 1.0000 | ORAL_TABLET | Freq: Two times a day (BID) | ORAL | 0 refills | Status: AC

## 2018-08-07 NOTE — Progress Notes (Signed)
Subjective:  Patient ID: Brittney Schmitt, female    DOB: Jun 29, 1960, 58 y.o.   MRN: [id number]  Chief Complaint:  URI (sinus pain, swelling in nasal passages, sinus congestion; symptoms began 3-4 days ago - leaving to go out of town; using saline nasal spray and Tylenol)   HPI: Brittney Schmitt is a 58 y.o. female presenting on 08/07/2018 for URI (sinus pain, swelling in nasal passages, sinus congestion; symptoms began 3-4 days ago - leaving to go out of town; using saline nasal spray and Tylenol)   Pt presents today with complaints of a sore to her right nostril. Pt states this started several days ago and is getting worse. Pt states she had this about one month ago and it went away after several days then returned a few days ago. Pt states she has used neosporin on the area without relief of symptoms. Pt denies fever, chills, drainage, fatigue, or visual changes. Pt states she works in a dry environment and is concerned this is the cause. She has been taking tylenol and using saline nasal sprays with some relief.    Relevant past medical, surgical, family, and social history reviewed and updated as indicated.  Allergies and medications reviewed and updated.   Past Medical History:  Diagnosis Date  . Arthritis   . Bronchitis   . Hypothyroidism   . Melanoma ([place])   . Mitral valve prolapse   . Seasonal allergies   . Thyroid disease     Past Surgical History:  Procedure Laterality Date  . ACHILLES TENDON REPAIR Left   . CLAVICLE SURGERY    . ELBOW SURGERY Left   . FOOT SURGERY    . TUBAL LIGATION  1992    Social History   Socioeconomic History  . Marital status: Married    Spouse name: Not on file  . Number of children: Not on file  . Years of education: Not on file  . Highest education level: Not on file  Occupational History  . Occupation: Civil engineer, contracting, Psychologist, counselling  Social Needs  . Financial resource strain: Not on file  . Food insecurity:    Worry: Not on file     Inability: Not on file  . Transportation needs:    Medical: Not on file    Non-medical: Not on file  Tobacco Use  . Smoking status: Former Smoker    Packs/day: 0.50    Years: 15.00    Pack years: 7.50    Types: Cigarettes    Last attempt to quit: 06/25/2000    Years since quitting: 18.1  . Smokeless tobacco: Never Used  Substance and Sexual Activity  . Alcohol use: Yes    Alcohol/week: 5.0 standard drinks    Types: 5 Cans of beer per week    Comment: beer or wine weekly  . Drug use: No  . Sexual activity: Not on file  Lifestyle  . Physical activity:    Days per week: Not on file    Minutes per session: Not on file  . Stress: Not on file  Relationships  . Social connections:    Talks on phone: Not on file    Gets together: Not on file    Attends religious service: Not on file    Active member of club or organization: Not on file    Attends meetings of clubs or organizations: Not on file    Relationship status: Not on file  . Intimate partner violence:    Fear  of current or ex partner: Not on file    Emotionally abused: Not on file    Physically abused: Not on file    Forced sexual activity: Not on file  Other Topics Concern  . Not on file  Social History Narrative  . Not on file    Outpatient Encounter Medications as of 08/07/2018  Medication Sig  . acetaminophen (TYLENOL) 325 MG tablet Take 650 mg by mouth every 6 (six) hours as needed.  . Biotin 1 MG CAPS Take by mouth.  . cholecalciferol (VITAMIN D3) 25 MCG (1000 UT) tablet Take 1,000 Units by mouth daily.  Marland Kitchen SYNTHROID 100 MCG tablet Take 1 tablet (100 mcg total) by mouth daily.  . mupirocin ointment (BACTROBAN) 2 % Place 1 application into the nose 2 (two) times daily.  Marland Kitchen sulfamethoxazole-trimethoprim (BACTRIM DS) 800-160 MG tablet Take 1 tablet by mouth 2 (two) times daily for 7 days.  . [DISCONTINUED] cetirizine (ZYRTEC) 10 MG tablet Take 10 mg by mouth daily.  . [DISCONTINUED] fluticasone (VERAMYST) 27.5  MCG/SPRAY nasal spray Place 2 sprays into the nose daily.  . [DISCONTINUED] ibuprofen (ADVIL,MOTRIN) 800 MG tablet Take 1 tablet (800 mg total) by mouth every 8 (eight) hours as needed for mild pain or moderate pain.  . [DISCONTINUED] vitamin C (ASCORBIC ACID) 500 MG tablet Take 500 mg by mouth daily.   No facility-administered encounter medications on file as of 08/07/2018.     Allergies  Allergen Reactions  . Codeine Nausea And Vomiting    Review of Systems  Constitutional: Negative for chills, fatigue and fever.  HENT:       Nasal swelling, sore in right nostril  Eyes: Negative for photophobia, pain, discharge, redness and visual disturbance.  Cardiovascular: Negative for chest pain and palpitations.  Skin: Positive for wound (right nostril).  Neurological: Negative for weakness and headaches.  Psychiatric/Behavioral: Negative for confusion.  All other systems reviewed and are negative.       Objective:  BP 124/85   Pulse 66   Temp (!) 97.1 F (36.2 C) (Oral)   Ht _0  (1.702 m)   Wt 148 lb 4 oz (67.2 kg)   BMI 23.22 kg/m    Wt Readings from Last 3 Encounters:  08/07/18 148 lb 4 oz (67.2 kg)  11/25/17 147 lb 3.2 oz (66.8 kg)  11/22/16 149 lb 9.6 oz (67.9 kg)    Physical Exam Vitals signs and nursing note reviewed.  Constitutional:      General: She is in acute distress (mild).     Appearance: Normal appearance. She is not ill-appearing or toxic-appearing.  HENT:     Head: Normocephalic and atraumatic.     Right Ear: Tympanic membrane, ear canal and external ear normal.     Left Ear: Tympanic membrane, ear canal and external ear normal.     Nose: Nasal tenderness present.     Right Turbinates: Not enlarged, swollen or pale.     Left Turbinates: Not enlarged, swollen or pale.     Right Sinus: No maxillary sinus tenderness or frontal sinus tenderness.     Left Sinus: No maxillary sinus tenderness or frontal sinus tenderness.      Mouth/Throat:     Mouth:  Mucous membranes are moist.     Pharynx: Oropharynx is clear. No oropharyngeal exudate or posterior oropharyngeal erythema.  Eyes:     General:        Right eye: No discharge.        Left  eye: No discharge.     Extraocular Movements: Extraocular movements intact.     Conjunctiva/sclera: Conjunctivae normal.     Pupils: Pupils are equal, round, and reactive to light.  Neck:     Musculoskeletal: Normal range of motion and neck supple.  Cardiovascular:     Rate and Rhythm: Normal rate and regular rhythm.     Heart sounds: Normal heart sounds. No murmur. No friction rub. No gallop.   Pulmonary:     Effort: Pulmonary effort is normal.     Breath sounds: Normal breath sounds.  Lymphadenopathy:     Cervical: No cervical adenopathy.  Skin:    General: Skin is warm and dry.     Capillary Refill: Capillary refill takes less than 2 seconds.  Neurological:     General: No focal deficit present.     Mental Status: She is alert and oriented to person, place, and time.  Psychiatric:        Mood and Affect: Mood normal.        Behavior: Behavior normal. Behavior is cooperative.        Thought Content: Thought content normal.        Judgment: Judgment normal.     Results for orders placed or performed in visit on 11/25/17  CBC with Differential/Platelet  Result Value Ref Range   WBC 6.8 3.4 - 10.8 x10E3/uL   RBC 4.74 3.77 - 5.28 x10E6/uL   Hemoglobin 14.2 11.1 - 15.9 g/dL   Hematocrit 42.1 34.0 - 46.6 %   MCV 89 79 - 97 fL   MCH 30.0 26.6 - 33.0 pg   MCHC 33.7 31.5 - 35.7 g/dL   RDW 13.7 12.3 - 15.4 %   Platelets 253 150 - 450 x10E3/uL   Neutrophils 68 Not Estab. %   Lymphs 21 Not Estab. %   Monocytes 8 Not Estab. %   Eos 2 Not Estab. %   Basos 1 Not Estab. %   Neutrophils Absolute 4.7 1.4 - 7.0 x10E3/uL   Lymphocytes Absolute 1.4 0.7 - 3.1 x10E3/uL   Monocytes Absolute 0.6 0.1 - 0.9 x10E3/uL   EOS (ABSOLUTE) 0.2 0.0 - 0.4 x10E3/uL   Basophils Absolute 0.0 0.0 - 0.2 x10E3/uL    Immature Granulocytes 0 Not Estab. %   Immature Grans (Abs) 0.0 0.0 - 0.1 x10E3/uL  CMP14+EGFR  Result Value Ref Range   Glucose 94 65 - 99 mg/dL   BUN 17 6 - 24 mg/dL   Creatinine, Ser 0.90 0.57 - 1.00 mg/dL   GFR calc non Af Amer 71 >59 mL/min/1.73   GFR calc Af Amer 82 >59 mL/min/1.73   BUN/Creatinine Ratio 19 9 - 23   Sodium 142 134 - 144 mmol/L   Potassium 4.8 3.5 - 5.2 mmol/L   Chloride 104 96 - 106 mmol/L   CO2 22 20 - 29 mmol/L   Calcium 9.6 8.7 - 10.2 mg/dL   Total Protein 6.7 6.0 - 8.5 g/dL   Albumin 4.5 3.5 - 5.5 g/dL   Globulin, Total 2.2 1.5 - 4.5 g/dL   Albumin/Globulin Ratio 2.0 1.2 - 2.2   Bilirubin Total 0.6 0.0 - 1.2 mg/dL   Alkaline Phosphatase 74 39 - 117 IU/L   AST 20 0 - 40 IU/L   ALT 14 0 - 32 IU/L  Lipid panel  Result Value Ref Range   Cholesterol, Total 186 100 - 199 mg/dL   Triglycerides 56 0 - 149 mg/dL   HDL 87 >39 mg/dL  VLDL Cholesterol Cal 11 5 - 40 mg/dL   LDL Calculated 88 0 - 99 mg/dL   Chol/HDL Ratio 2.1 0.0 - 4.4 ratio  TSH  Result Value Ref Range   TSH 2.710 0.450 - 4.500 uIU/mL  Hepatitis C antibody  Result Value Ref Range   Hep C Virus Ab <0.1 0.0 - 0.9 s/co ratio       Pertinent labs & imaging results that were available during my care of the patient were reviewed by me and considered in my medical decision making.  Assessment & Plan:  Jacoria was seen today for uri.  Diagnoses and all orders for this visit:  Nasal abscess Wound care discussed. Warm compresses to facilitate draining. Medications as prescribed. Report any new or worsening symptoms.  -     sulfamethoxazole-trimethoprim (BACTRIM DS) 800-160 MG tablet; Take 1 tablet by mouth 2 (two) times daily for 7 days. -     mupirocin ointment (BACTROBAN) 2 %; Place 1 application into the nose 2 (two) times daily.     Continue all other maintenance medications.  Follow up plan: Return if symptoms worsen or fail to improve.  Educational handout given for skin  abscess  The above assessment and management plan was discussed with the patient. The patient verbalized understanding of and has agreed to the management plan. Patient is aware to call the clinic if symptoms persist or worsen. Patient is aware when to return to the clinic for a follow-up visit. Patient educated on when it is appropriate to go to the emergency department.   Monia Pouch, FNP-C [facility] [phone]

## 2018-08-07 NOTE — Patient Instructions (Signed)

## 2018-11-18 ENCOUNTER — Other Ambulatory Visit: Payer: Self-pay | Admitting: Family Medicine

## 2018-11-19 NOTE — Telephone Encounter (Signed)
NOV 11/28/18

## 2018-11-27 ENCOUNTER — Encounter: Payer: BLUE CROSS/BLUE SHIELD | Admitting: Pediatrics

## 2018-11-28 ENCOUNTER — Encounter: Payer: BLUE CROSS/BLUE SHIELD | Admitting: Family Medicine

## 2018-12-24 ENCOUNTER — Encounter: Payer: Self-pay | Admitting: Family Medicine

## 2018-12-24 ENCOUNTER — Other Ambulatory Visit: Payer: Self-pay

## 2018-12-24 ENCOUNTER — Ambulatory Visit (INDEPENDENT_AMBULATORY_CARE_PROVIDER_SITE_OTHER): Payer: BLUE CROSS/BLUE SHIELD | Admitting: Family Medicine

## 2018-12-24 VITALS — BP 129/78 | HR 61 | Temp 98.5°F | Ht 67.0 in | Wt 151.4 lb

## 2018-12-24 DIAGNOSIS — D489 Neoplasm of uncertain behavior, unspecified: Secondary | ICD-10-CM

## 2018-12-24 DIAGNOSIS — E039 Hypothyroidism, unspecified: Secondary | ICD-10-CM | POA: Diagnosis not present

## 2018-12-24 DIAGNOSIS — Z0001 Encounter for general adult medical examination with abnormal findings: Secondary | ICD-10-CM | POA: Diagnosis not present

## 2018-12-24 DIAGNOSIS — Z130 Encounter for screening for diseases of the blood and blood-forming organs and certain disorders involving the immune mechanism: Secondary | ICD-10-CM | POA: Diagnosis not present

## 2018-12-24 DIAGNOSIS — L819 Disorder of pigmentation, unspecified: Secondary | ICD-10-CM | POA: Diagnosis not present

## 2018-12-24 DIAGNOSIS — Z85820 Personal history of malignant melanoma of skin: Secondary | ICD-10-CM

## 2018-12-24 DIAGNOSIS — Z0000 Encounter for general adult medical examination without abnormal findings: Secondary | ICD-10-CM

## 2018-12-24 MED ORDER — SYNTHROID 100 MCG PO TABS: 100.0000 ug | ORAL_TABLET | Freq: Every day | ORAL | 1 refills | Status: DC

## 2018-12-24 NOTE — Progress Notes (Signed)
Brittney Schmitt is a 58 y.o. female presents to office today for annual physical exam examination.    Concerns today include: 1. Right thigh mole Patient reports that a mole on the right anterior thigh has been appearing quite dark recently.  She has history of melanoma in the left thigh status post resection 10 years ago.  She sees Dr. Nevada Crane but is actually looking to see a new dermatologist.  She does spend quite a bit of time outside in her garden and frequently gets a lot of sunshine.  2.  Hypothyroidism Patient with longstanding history of hypothyroidism.  She notes that this was diagnosed about 10 to 15 years ago.  No history of radiation or surgery to the neck.  She has been stable on Synthroid 100 mcg daily, which she is compliant with.  She does use biotin regularly.  She was unaware this interferes with her thyroid lab.  She denies any change in voice, change in weight, change in bowel habits, difficulty swallowing, heart palpitations.  Marital status: married, Substance use: none Diet: balanced, Exercise: active Last mammogram: needs to schedule (previously done at [facility] in [place]) Last pap smear: UTD 2018 Refills needed today: Synthroid Immunizations needed: UTD  Past Medical History:  Diagnosis Date  . Arthritis   . Bronchitis   . Hypothyroidism   . Melanoma ([place])   . Mitral valve prolapse   . Seasonal allergies   . Thyroid disease    Social History   Socioeconomic History  . Marital status: Married    Spouse name: Not on file  . Number of children: Not on file  . Years of education: Not on file  . Highest education level: Not on file  Occupational History  . Occupation: Civil engineer, contracting, Psychologist, counselling  Social Needs  . Financial resource strain: Not on file  . Food insecurity    Worry: Not on file    Inability: Not on file  . Transportation needs    Medical: Not on file    Non-medical: Not on file  Tobacco Use  . Smoking status: Former Smoker   Packs/day: 0.50    Years: 15.00    Pack years: 7.50    Types: Cigarettes    Quit date: 06/25/2000    Years since quitting: 18.5  . Smokeless tobacco: Never Used  Substance and Sexual Activity  . Alcohol use: Yes    Alcohol/week: 5.0 standard drinks    Types: 5 Cans of beer per week    Comment: beer or wine weekly  . Drug use: No  . Sexual activity: Not on file  Lifestyle  . Physical activity    Days per week: Not on file    Minutes per session: Not on file  . Stress: Not on file  Relationships  . Social Herbalist on phone: Not on file    Gets together: Not on file    Attends religious service: Not on file    Active member of club or organization: Not on file    Attends meetings of clubs or organizations: Not on file    Relationship status: Not on file  . Intimate partner violence    Fear of current or ex partner: Not on file    Emotionally abused: Not on file    Physically abused: Not on file    Forced sexual activity: Not on file  Other Topics Concern  . Not on file  Social History Narrative  . Not on  file   Past Surgical History:  Procedure Laterality Date  . ACHILLES TENDON REPAIR Left   . CLAVICLE SURGERY    . ELBOW SURGERY Left   . FOOT SURGERY    . TUBAL LIGATION  1992   Family History  Problem Relation Age of Onset  . Pulmonary fibrosis Father   . Asthma Mother   . Hypertension Mother   . Cancer Mother        breast  . Allergies Sister     Current Outpatient Medications:  .  acetaminophen (TYLENOL) 325 MG tablet, Take 650 mg by mouth every 6 (six) hours as needed., Disp: , Rfl:  .  Biotin 1 MG CAPS, Take by mouth., Disp: , Rfl:  .  cholecalciferol (VITAMIN D3) 25 MCG (1000 UT) tablet, Take 1,000 Units by mouth daily., Disp: , Rfl:  .  Cyanocobalamin (VITAMIN B 12) 500 MCG TABS, Take by mouth., Disp: , Rfl:  .  SYNTHROID 100 MCG tablet, TAKE 1 TABLET BY MOUTH  DAILY, Disp: 90 tablet, Rfl: 0  Allergies  Allergen Reactions  . Codeine Nausea  And Vomiting     ROS: Review of Systems Constitutional: negative Eyes: negative Ears, nose, mouth, throat, and face: negative Respiratory: negative Cardiovascular: negative Gastrointestinal: negative Genitourinary:negative Integument/breast: positive for skin lesion(s) Hematologic/lymphatic: negative Musculoskeletal:negative Neurological: negative Behavioral/Psych: negative Endocrine: negative Allergic/Immunologic: negative    Physical exam BP 129/78   Pulse 61   Temp 98.5 F (36.9 C) (Oral)   Ht _0  (1.702 m)   Wt 151 lb 6.4 oz (68.7 kg)   BMI 23.71 kg/m  General appearance: alert, cooperative, appears stated age and no distress Head: Normocephalic, without obvious abnormality, atraumatic Eyes: negative findings: lids and lashes normal, conjunctivae and sclerae normal, corneas clear and pupils equal, round, reactive to light and accomodation Ears: normal TM's and external ear canals both ears Nose: Nares normal. Septum midline. Mucosa normal. No drainage or sinus tenderness. Throat: lips, mucosa, and tongue normal; teeth and gums normal Neck: no adenopathy, supple, symmetrical, trachea midline and thyroid not enlarged, symmetric, no tenderness/mass/nodules Back: symmetric, no curvature. ROM normal. No CVA tenderness. Lungs: clear to auscultation bilaterally Heart: regular rate and rhythm, S1, S2 normal, no murmur, click, rub or gallop Abdomen: soft, non-tender; bowel sounds normal; no masses,  no organomegaly Extremities: extremities normal, atraumatic, no cyanosis or edema Pulses: 2+ and symmetric Skin: Multiple pigmented skin lesions noted along bilateral lower extremities.  She has a~3.108mx2mm pigmented, flat skin lesion noted along the anterior mid thigh. Lymph nodes: Cervical, supraclavicular, and axillary nodes normal. Neurologic: Alert and oriented X 3, normal strength and tone. Normal symmetric reflexes. Normal coordination and gait Psych: Mood stable, speech  normal, affect appropriate, pleasant and interactive  Skin Biopsy Procedure:  Risks and benefits of procedure were reviewed with the patient.  Written consent scanned into chart.  Area of concern located on right anterior thigh.  Area was cleaned with alcohol swabs x2.  Local anesthesia was achieved by injecting 2 cc of lidocaine 1% without epinephrine.  The area was then cleaned with Betadine swabs x2.  A skin blade was used to perform a deep biopsy of the lesion.  Estimated blood loss less than 5cc.  Hemostasis achieved ultimately with silver nitrate.  Area was cleaned, and a clean bandage was applied.  Patient tolerated procedure well and there were no immediate complications.  Home care instructions were reviewed with the patient.   Assessment/ Plan: LAnnamarie Dawleyhere for annual physical exam.  1. Annual physical exam  2. Hypothyroidism, unspecified type Will come in for fasting labs and discontinue biotin for 2 weeks prior to thyroid lab.  Asymptomatic.  Refill sent. - Lipid Panel; Future - Thyroid Panel With TSH; Future - CMP14+EGFR; Future  3. Screening, anemia, deficiency, iron - CBC; Future  4. Pigmented skin lesion of uncertain nature Given history of melanoma in the past I have gone ahead and biopsied this.  I have given her names of dermatologist that may be a better fit for her.  A deep shave biopsy was performed today.  There was no appreciable pigment noted beneath the lesion.  Hemostasis was ultimately achieved using a silver nitrate stick.  Home care instructions reviewed with the patient and a handout was provided.  I did recommend that she proceed with full body exam with dermatology as soon as possible given history of melanoma - Pathology Report ([facility])  5. History of melanoma - Pathology Report ([facility])   Handout provided on healthy lifestyle choices, including diet (rich in fruits, vegetables and lean meats and low in salt and simple carbohydrates) and exercise  (at least 30 minutes of moderate physical activity daily).  Patient to follow up in 1 year for annual exam or sooner if needed.  Jobeth Pangilinan M. Lajuana Ripple, DO

## 2018-12-24 NOTE — Patient Instructions (Signed)
Dr Elvera Lennox with [facility] is awesome.    You should certainly have a full body skin exam by a dermatologist yearly given your history.  Labs have been placed to have done at your convenience.  Come in fasting.  Remember to stop Biotin for 2 full weeks before coming for labs.  Schedule your mammogram.  You had labs performed today.  You will be contacted with the results of the labs once they are available, usually in the next 3 business days for routine lab work.  If you have an active my chart account, they will be released to your MyChart.  If you prefer to have these labs released to you via telephone, please let us know.  If you had a pap smear or biopsy performed, expect to be contacted in about 7-10 days.   Skin Biopsy, Care After This sheet gives you information about how to care for yourself after your procedure. Your health care provider may also give you more specific instructions. If you have problems or questions, contact your health care provider. What can I expect after the procedure? After the procedure, it is common to have:  Soreness.  Bruising.  Itching. Follow these instructions at home: Biopsy site care Follow instructions from your health care provider about how to take care of your biopsy site. Make sure you:  Wash your hands with soap and water before and after you change your bandage (dressing). If soap and water are not available, use hand sanitizer.  Apply ointment on your biopsy site as directed by your health care provider.  Change your dressing as told by your health care provider.  Leave stitches (sutures), skin glue, or adhesive strips in place. These skin closures may need to stay in place for 2 weeks or longer. If adhesive strip edges start to loosen and curl up, you may trim the loose edges. Do not remove adhesive strips completely unless your health care provider tells you to do that.  If the biopsy area bleeds, apply gentle  pressure for 10 minutes. Check your biopsy site every day for signs of infection. Check for:  Redness, swelling, or pain.  Fluid or blood.  Warmth.  Pus or a bad smell.  General instructions  Rest and then return to your normal activities as told by your health care provider.  Take over-the-counter and prescription medicines only as told by your health care provider.  Keep all follow-up visits as told by your health care provider. This is important. Contact a health care provider if:  You have redness, swelling, or pain around your biopsy site.  You have fluid or blood coming from your biopsy site.  Your biopsy site feels warm to the touch.  You have pus or a bad smell coming from your biopsy site.  You have a fever.  Your sutures, skin glue, or adhesive strips loosen or come off sooner than expected. Get help right away if:  You have bleeding that does not stop with pressure or a dressing. Summary  After the procedure, it is common to have soreness, bruising, and itching at the site.  Follow instructions from your health care provider about how to take care of your biopsy site.  Check your biopsy site every day for signs of infection.  Contact a health care provider if you have redness, swelling, or pain around your biopsy site, or your biopsy site feels warm to the touch.  Keep all follow-up visits as told by your health care provider.  This is important. This information is not intended to replace advice given to you by your health care provider. Make sure you discuss any questions you have with your health care provider. Document Released: 07/08/2015 Document Revised: 12/09/2017 Document Reviewed: 12/09/2017 Elsevier Patient Education  2020 [place] Maintenance, Female Adopting a healthy lifestyle and getting preventive care are important in promoting health and wellness. Ask your health care provider about:  The right schedule for you to have  regular tests and exams.  Things you can do on your own to prevent diseases and keep yourself healthy. What should I know about diet, weight, and exercise? Eat a healthy diet   Eat a diet that includes plenty of vegetables, fruits, low-fat dairy products, and lean protein.  Do not eat a lot of foods that are high in solid fats, added sugars, or sodium. Maintain a healthy weight Body mass index (BMI) is used to identify weight problems. It estimates body fat based on height and weight. Your health care provider can help determine your BMI and help you achieve or maintain a healthy weight. Get regular exercise Get regular exercise. This is one of the most important things you can do for your health. Most adults should:  Exercise for at least 150 minutes each week. The exercise should increase your heart rate and make you sweat (moderate-intensity exercise).  Do strengthening exercises at least twice a week. This is in addition to the moderate-intensity exercise.  Spend less time sitting. Even light physical activity can be beneficial. Watch cholesterol and blood lipids Have your blood tested for lipids and cholesterol at 60 years of age, then have this test every 5 years. Have your cholesterol levels checked more often if:  Your lipid or cholesterol levels are high.  You are older than 60 years of age.  You are at high risk for heart disease. What should I know about cancer screening? Depending on your health history and family history, you may need to have cancer screening at various ages. This may include screening for:  Breast cancer.  Cervical cancer.  Colorectal cancer.  Skin cancer.  Lung cancer. What should I know about heart disease, diabetes, and high blood pressure? Blood pressure and heart disease  High blood pressure causes heart disease and increases the risk of stroke. This is more likely to develop in people who have high blood pressure readings, are of  African descent, or are overweight.  Have your blood pressure checked: ? Every 3-5 years if you are 22-29 years of age. ? Every year if you are 24 years old or older. Diabetes Have regular diabetes screenings. This checks your fasting blood sugar level. Have the screening done:  Once every three years after age 76 if you are at a normal weight and have a low risk for diabetes.  More often and at a younger age if you are overweight or have a high risk for diabetes. What should I know about preventing infection? Hepatitis B If you have a higher risk for hepatitis B, you should be screened for this virus. Talk with your health care provider to find out if you are at risk for hepatitis B infection. Hepatitis C Testing is recommended for:  Everyone born from [birth year] through 1967.  Anyone with known risk factors for hepatitis C. Sexually transmitted infections (STIs)  Get screened for STIs, including gonorrhea and chlamydia, if: ? You are sexually active and are younger than 60 years of age. ? You are  older than 60 years of age and your health care provider tells you that you are at risk for this type of infection. ? Your sexual activity has changed since you were last screened, and you are at increased risk for chlamydia or gonorrhea. Ask your health care provider if you are at risk.  Ask your health care provider about whether you are at high risk for HIV. Your health care provider may recommend a prescription medicine to help prevent HIV infection. If you choose to take medicine to prevent HIV, you should first get tested for HIV. You should then be tested every 3 months for as long as you are taking the medicine. Pregnancy  If you are about to stop having your period (premenopausal) and you may become pregnant, seek counseling before you get pregnant.  Take 400 to 800 micrograms (mcg) of folic acid every day if you become pregnant.  Ask for birth control (contraception) if you want to  prevent pregnancy. Osteoporosis and menopause Osteoporosis is a disease in which the bones lose minerals and strength with aging. This can result in bone fractures. If you are 50 years old or older, or if you are at risk for osteoporosis and fractures, ask your health care provider if you should:  Be screened for bone loss.  Take a calcium or vitamin D supplement to lower your risk of fractures.  Be given hormone replacement therapy (HRT) to treat symptoms of menopause. Follow these instructions at home: Lifestyle  Do not use any products that contain nicotine or tobacco, such as cigarettes, e-cigarettes, and chewing tobacco. If you need help quitting, ask your health care provider.  Do not use street drugs.  Do not share needles.  Ask your health care provider for help if you need support or information about quitting drugs. Alcohol use  Do not drink alcohol if: ? Your health care provider tells you not to drink. ? You are pregnant, may be pregnant, or are planning to become pregnant.  If you drink alcohol: ? Limit how much you use to 0-1 drink a day. ? Limit intake if you are breastfeeding.  Be aware of how much alcohol is in your drink. In the [place] one drink equals one 12 oz bottle of beer (355 mL), one 5 oz glass of wine (148 mL), or one 1 oz glass of hard liquor (44 mL). General instructions  Schedule regular health, dental, and eye exams.  Stay current with your vaccines.  Tell your health care provider if: ? You often feel depressed. ? You have ever been abused or do not feel safe at home. Summary  Adopting a healthy lifestyle and getting preventive care are important in promoting health and wellness.  Follow your health care provider's instructions about healthy diet, exercising, and getting tested or screened for diseases.  Follow your health care provider's instructions on monitoring your cholesterol and blood pressure. This information is not intended to  replace advice given to you by your health care provider. Make sure you discuss any questions you have with your health care provider. Document Released: 12/25/2010 Document Revised: 06/04/2018 Document Reviewed: 06/04/2018 Elsevier Patient Education  2020 [employer].

## 2018-12-31 LAB — ANATOMIC PATHOLOGY REPORT: PDF Image: 0

## 2019-01-07 ENCOUNTER — Encounter: Payer: Self-pay | Admitting: *Deleted

## 2019-01-13 ENCOUNTER — Encounter: Payer: Self-pay | Admitting: Family Medicine

## 2019-01-20 DIAGNOSIS — Z1231 Encounter for screening mammogram for malignant neoplasm of breast: Secondary | ICD-10-CM | POA: Diagnosis not present

## 2019-07-24 ENCOUNTER — Other Ambulatory Visit: Payer: Self-pay | Admitting: Family Medicine

## 2019-07-27 NOTE — Telephone Encounter (Signed)
Gottschalk. Needs labwork. July's labs are still active, not complete. Sent refill to mail order

## 2019-09-30 ENCOUNTER — Ambulatory Visit: Payer: Self-pay | Attending: Internal Medicine

## 2019-09-30 DIAGNOSIS — Z23 Encounter for immunization: Secondary | ICD-10-CM

## 2019-09-30 NOTE — Progress Notes (Signed)
   Covid-19 Vaccination Clinic  Name:  Brittney Schmitt    MRN: [id number] DOB: 1960-08-29  09/30/2019  Brittney Schmitt was observed post Covid-19 immunization for 15 minutes without incident. She was provided with Vaccine Information Sheet and instruction to access the V-Safe system.   Brittney Schmitt was instructed to call 911 with any severe reactions post vaccine: Marland Kitchen Difficulty breathing  . Swelling of face and throat  . A fast heartbeat  . A bad rash all over body  . Dizziness and weakness   Immunizations Administered    Name Date Dose VIS Date Route   Moderna COVID-19 Vaccine 09/30/2019 10:59 AM 0.5 mL 05/26/2019 Intramuscular   Manufacturer: Levan Hurst   [id number]   [place][id number]

## 2019-10-09 DIAGNOSIS — M7541 Impingement syndrome of right shoulder: Secondary | ICD-10-CM | POA: Diagnosis not present

## 2019-10-09 DIAGNOSIS — J3489 Other specified disorders of nose and nasal sinuses: Secondary | ICD-10-CM | POA: Diagnosis not present

## 2019-10-17 ENCOUNTER — Other Ambulatory Visit: Payer: Self-pay | Admitting: Family Medicine

## 2019-10-19 NOTE — Telephone Encounter (Signed)
OV 12/25/19

## 2019-10-22 DIAGNOSIS — M7541 Impingement syndrome of right shoulder: Secondary | ICD-10-CM | POA: Diagnosis not present

## 2019-10-22 DIAGNOSIS — M25711 Osteophyte, right shoulder: Secondary | ICD-10-CM | POA: Diagnosis not present

## 2019-10-22 DIAGNOSIS — M19011 Primary osteoarthritis, right shoulder: Secondary | ICD-10-CM | POA: Diagnosis not present

## 2019-12-25 ENCOUNTER — Encounter: Payer: BLUE CROSS/BLUE SHIELD | Admitting: Family Medicine

## 2020-01-06 ENCOUNTER — Other Ambulatory Visit: Payer: Self-pay | Admitting: Family Medicine

## 2020-01-22 ENCOUNTER — Other Ambulatory Visit: Payer: Self-pay | Admitting: Family Medicine

## 2020-01-25 NOTE — Telephone Encounter (Signed)
Gottschalk. NTBS LOV 12/24/18

## 2020-04-18 DIAGNOSIS — R7309 Other abnormal glucose: Secondary | ICD-10-CM | POA: Diagnosis not present

## 2020-04-18 DIAGNOSIS — E039 Hypothyroidism, unspecified: Secondary | ICD-10-CM | POA: Diagnosis not present

## 2020-04-18 DIAGNOSIS — Z0000 Encounter for general adult medical examination without abnormal findings: Secondary | ICD-10-CM | POA: Diagnosis not present

## 2020-07-08 DIAGNOSIS — Z1231 Encounter for screening mammogram for malignant neoplasm of breast: Secondary | ICD-10-CM | POA: Diagnosis not present

## 2021-04-25 DIAGNOSIS — E039 Hypothyroidism, unspecified: Secondary | ICD-10-CM | POA: Diagnosis not present

## 2021-04-25 DIAGNOSIS — E559 Vitamin D deficiency, unspecified: Secondary | ICD-10-CM | POA: Diagnosis not present

## 2021-04-25 DIAGNOSIS — R7309 Other abnormal glucose: Secondary | ICD-10-CM | POA: Diagnosis not present

## 2021-04-25 DIAGNOSIS — Z0000 Encounter for general adult medical examination without abnormal findings: Secondary | ICD-10-CM | POA: Diagnosis not present

## 2021-04-27 ENCOUNTER — Ambulatory Visit: Payer: Self-pay | Admitting: Physician Assistant

## 2021-05-01 ENCOUNTER — Ambulatory Visit: Payer: Self-pay | Admitting: Physician Assistant

## 2021-05-01 ENCOUNTER — Ambulatory Visit (INDEPENDENT_AMBULATORY_CARE_PROVIDER_SITE_OTHER): Payer: BLUE CROSS/BLUE SHIELD | Admitting: Physician Assistant

## 2021-05-01 ENCOUNTER — Other Ambulatory Visit: Payer: Self-pay

## 2021-05-01 ENCOUNTER — Encounter: Payer: Self-pay | Admitting: Physician Assistant

## 2021-05-01 DIAGNOSIS — S40211A Abrasion of right shoulder, initial encounter: Secondary | ICD-10-CM | POA: Diagnosis not present

## 2021-05-01 DIAGNOSIS — C44311 Basal cell carcinoma of skin of nose: Secondary | ICD-10-CM

## 2021-05-01 DIAGNOSIS — D485 Neoplasm of uncertain behavior of skin: Secondary | ICD-10-CM

## 2021-05-01 DIAGNOSIS — Z1283 Encounter for screening for malignant neoplasm of skin: Secondary | ICD-10-CM

## 2021-05-01 DIAGNOSIS — B079 Viral wart, unspecified: Secondary | ICD-10-CM

## 2021-05-01 DIAGNOSIS — L570 Actinic keratosis: Secondary | ICD-10-CM

## 2021-05-01 DIAGNOSIS — Z23 Encounter for immunization: Secondary | ICD-10-CM | POA: Diagnosis not present

## 2021-05-01 DIAGNOSIS — T148XXA Other injury of unspecified body region, initial encounter: Secondary | ICD-10-CM

## 2021-05-01 DIAGNOSIS — Z85820 Personal history of malignant melanoma of skin: Secondary | ICD-10-CM

## 2021-05-01 MED ORDER — MUPIROCIN 2 % EX OINT: 1.0000 "application " | TOPICAL_OINTMENT | Freq: Two times a day (BID) | CUTANEOUS | 0 refills | Status: AC

## 2021-05-01 MED ORDER — FLUOROURACIL 5 % EX CREA: TOPICAL_CREAM | Freq: Two times a day (BID) | CUTANEOUS | 0 refills | Status: AC

## 2021-05-01 NOTE — Patient Instructions (Signed)

## 2021-05-01 NOTE — Progress Notes (Signed)
   New Patient   Subjective  Brittney Schmitt is a 60 y.o. female who presents for the following: New Patient (Initial Visit) (Patient here today for skin check. Per patient she has a lesion on her nose x 6 months per patient it was bleeding. Personal history of Melanoma on left thigh x 15 years ago. ).   The following portions of the chart were reviewed this encounter and updated as appropriate:  Tobacco  Allergies  Meds  Problems  Med Hx  Surg Hx  Fam Hx      Objective  Well appearing patient in no apparent distress; mood and affect are within normal limits.  A full examination was performed including scalp, head, eyes, ears, nose, lips, neck, chest, axillae, abdomen, back, buttocks, bilateral upper extremities, bilateral lower extremities, hands, feet, fingers, toes, fingernails, and toenails. All findings within normal limits unless otherwise noted below.  Left Thigh - Anterior Removed X 15 years ago  Right Shoulder - Anterior Discoloration and xerosis  Mid Supratip of Nose Pearly papule. Persistent.       Philtrum Erythematous patches with gritty scale.  Right Hallux Interphalangeal Joint of Toe Clustered Verrucous papules -- Discussed viral etiology and contagion.   Assessment & Plan  Personal history of malignant melanoma of skin Left Thigh - Anterior  Abrasion Right Shoulder - Anterior  mupirocin ointment (BACTROBAN) 2 % - Right Shoulder - Anterior Apply 1 application topically 2 (two) times daily.  Neoplasm of uncertain behavior of skin Mid Supratip of Nose  Skin / nail biopsy Type of biopsy: tangential   Informed consent: discussed and consent obtained   Timeout: patient name, date of birth, surgical site, and procedure verified   Anesthesia: the lesion was anesthetized in a standard fashion   Anesthetic:  1% lidocaine w/ epinephrine 1-100,000 local infiltration Instrument used: flexible razor blade   Hemostasis achieved with: ferric subsulfate    Outcome: patient tolerated procedure well   Post-procedure details: wound care instructions given    Specimen 1 - Surgical pathology Differential Diagnosis: scc vs bcc  Check Margins: No  AK (actinic keratosis) Philtrum  fluorouracil (EFUDEX) 5 % cream - Philtrum Apply topically 2 (two) times daily.  Viral warts, unspecified type Right Hallux Interphalangeal Joint of Toe  Destruction of lesion - Right Hallux Interphalangeal Joint of Toe Complexity: simple   Destruction method: cryotherapy   Informed consent: discussed and consent obtained   Timeout:  patient name, date of birth, surgical site, and procedure verified Lesion destroyed using liquid nitrogen: Yes   Cryotherapy cycles:  1 Outcome: patient tolerated procedure well with no complications   Post-procedure details: wound care instructions given      I, Brittney Tudor, PA-C, have reviewed all documentation's for this visit.  The documentation on 05/01/21 for the exam, diagnosis, procedures and orders are all accurate and complete.

## 2021-05-08 ENCOUNTER — Telehealth: Payer: Self-pay | Admitting: *Deleted

## 2021-05-08 NOTE — Telephone Encounter (Signed)
Pathology to patient- referral sent to skin surgery center for MOHS 

## 2021-05-08 NOTE — Telephone Encounter (Signed)
-----   Message from Warren Danes, [place] sent at 05/05/2021 11:42 AM EST ----- [email or web address]

## 2021-05-09 DIAGNOSIS — Z0000 Encounter for general adult medical examination without abnormal findings: Secondary | ICD-10-CM | POA: Diagnosis not present

## 2021-05-09 DIAGNOSIS — R7309 Other abnormal glucose: Secondary | ICD-10-CM | POA: Diagnosis not present

## 2021-05-09 DIAGNOSIS — Z23 Encounter for immunization: Secondary | ICD-10-CM | POA: Diagnosis not present

## 2021-05-09 DIAGNOSIS — Z1151 Encounter for screening for human papillomavirus (HPV): Secondary | ICD-10-CM | POA: Diagnosis not present

## 2021-05-09 DIAGNOSIS — E039 Hypothyroidism, unspecified: Secondary | ICD-10-CM | POA: Diagnosis not present

## 2021-05-09 DIAGNOSIS — Z124 Encounter for screening for malignant neoplasm of cervix: Secondary | ICD-10-CM | POA: Diagnosis not present

## 2021-05-30 ENCOUNTER — Telehealth: Payer: Self-pay | Admitting: Physician Assistant

## 2021-05-30 NOTE — Telephone Encounter (Signed)
Patient called to say that she has not heard from the [facility] and it has been about 2 weeks.  I explained that they are behind and as soon as we get the appointment information we will contact her.  (I also offered to give her the number to [facility], but she declined.

## 2021-06-13 ENCOUNTER — Ambulatory Visit: Payer: Self-pay | Admitting: Physician Assistant

## 2021-06-15 DIAGNOSIS — R109 Unspecified abdominal pain: Secondary | ICD-10-CM | POA: Diagnosis not present

## 2021-06-15 DIAGNOSIS — R197 Diarrhea, unspecified: Secondary | ICD-10-CM | POA: Diagnosis not present

## 2021-07-03 ENCOUNTER — Other Ambulatory Visit: Payer: Self-pay | Admitting: Family Medicine

## 2021-07-03 DIAGNOSIS — R140 Abdominal distension (gaseous): Secondary | ICD-10-CM

## 2021-07-03 DIAGNOSIS — R109 Unspecified abdominal pain: Secondary | ICD-10-CM

## 2021-07-03 DIAGNOSIS — R197 Diarrhea, unspecified: Secondary | ICD-10-CM

## 2022-05-01 ENCOUNTER — Ambulatory Visit: Payer: BLUE CROSS/BLUE SHIELD | Admitting: Physician Assistant

## 2024-04-23 ENCOUNTER — Ambulatory Visit (INDEPENDENT_AMBULATORY_CARE_PROVIDER_SITE_OTHER)

## 2024-04-23 ENCOUNTER — Ambulatory Visit: Admitting: Podiatry

## 2024-04-23 DIAGNOSIS — M2041 Other hammer toe(s) (acquired), right foot: Secondary | ICD-10-CM

## 2024-04-23 DIAGNOSIS — M2040 Other hammer toe(s) (acquired), unspecified foot: Secondary | ICD-10-CM

## 2024-04-23 DIAGNOSIS — D2371 Other benign neoplasm of skin of right lower limb, including hip: Secondary | ICD-10-CM

## 2024-04-24 NOTE — Progress Notes (Signed)
 Subjective:  Patient ID: Brittney Schmitt, female    DOB: 11-05-1960,  MRN: [id number] HPI Chief Complaint  Patient presents with   Bunions   Hammer Toe    63 y.o. female presents with the above complaint.   ROS: Denies fever chills nausea mobic muscle aches pains calf pain back pain chest pain shortness of breath.  Past Medical History:  Diagnosis Date   Arthritis    Bronchitis    Hypothyroidism    Melanoma (HCC)    Mitral valve prolapse    Seasonal allergies    Thyroid disease    Past Surgical History:  Procedure Laterality Date   ACHILLES TENDON REPAIR Left    CLAVICLE SURGERY     ELBOW SURGERY Left    FOOT SURGERY     TUBAL LIGATION  1992    Current Outpatient Medications:    acetaminophen (TYLENOL) 325 MG tablet, Take 650 mg by mouth every 6 (six) hours as needed., Disp: , Rfl:    BINAXNOW COVID-19 AG HOME TEST KIT, Use as Directed on the Package, Disp: , Rfl:    Biotin 1 MG CAPS, Take by mouth., Disp: , Rfl:    cetirizine (ZYRTEC) 10 MG tablet, Take by mouth., Disp: , Rfl:    cholecalciferol (VITAMIN D3) 25 MCG (1000 UT) tablet, Take 1,000 Units by mouth daily., Disp: , Rfl:    Cyanocobalamin (VITAMIN B 12) 500 MCG TABS, Take by mouth., Disp: , Rfl:    fluorouracil  (EFUDEX ) 5 % cream, Apply topically 2 (two) times daily., Disp: 40 g, Rfl: 0   mupirocin  ointment (BACTROBAN ) 2 %, Apply 1 application topically 2 (two) times daily., Disp: 22 g, Rfl: 0   SYNTHROID  100 MCG tablet, TAKE 1 TABLET BY MOUTH  DAILY, Disp: 90 tablet, Rfl: 0  Allergies  Allergen Reactions   Codeine Nausea And Vomiting   Review of Systems Objective:  There were no vitals filed for this visit.  General: Well developed, nourished, in no acute distress, alert and oriented x3   Dermatological: Skin is warm, dry and supple bilateral. Nails x 10 are well maintained; remaining integument appears unremarkable at this time. There are no open sores, no preulcerative lesions, no rash or signs of  infection present.  Vascular: Dorsalis Pedis artery and Posterior Tibial artery pedal pulses are 2/4 bilateral with immedate capillary fill time. Pedal hair growth present. No varicosities and no lower extremity edema present bilateral.   Neruologic: Grossly intact via light touch bilateral. Vibratory intact via tuning fork bilateral. Protective threshold with Semmes Wienstein monofilament intact to all pedal sites bilateral. Patellar and Achilles deep tendon reflexes 2+ bilateral. No Babinski or clonus noted bilateral.   Musculoskeletal: No gross boney pedal deformities bilateral. No pain, crepitus, or limitation noted with foot and ankle range of motion bilateral. Muscular strength 5/5 in all groups tested bilateral.  She has a history of fusion to the first metatarsophalangeal joints bilaterally right greater than left with crowding of the toes.  This is resulted in benign skin lesions plantar aspect of the foot and interdigitally.  Gait: Unassisted, Nonantalgic.    Radiographs:  Radiographs taken today of the right foot demonstrates complete fusion with retention of internal fixation right first metatarsal phalangeal joint.  Osteoarthritic changes to her toes which is also resulting in some of the reactive hyperkeratotic lesions medially and laterally at the interphalangeal joints.  No open lesions or wounds.    Assessment & Plan:   Assessment: Painful benign soft tissue lesions bilateral foot  Plan: Debrided benign skin lesions today discussed toe socks spacers possible need for orthotics.     Brittney Schmitt T. [place], [place]
